# Patient Record
Sex: Male | Born: 1980 | Race: Black or African American | Hispanic: No | Marital: Married | Smoking: Never smoker
Health system: Southern US, Community
[De-identification: ages and names within clinical notes are randomized; demographics above are authoritative.]

## PROBLEM LIST (undated history)

## (undated) DIAGNOSIS — E669 Obesity, unspecified: Secondary | ICD-10-CM

## (undated) DIAGNOSIS — E785 Hyperlipidemia, unspecified: Secondary | ICD-10-CM

## (undated) DIAGNOSIS — I10 Essential (primary) hypertension: Secondary | ICD-10-CM

## (undated) DIAGNOSIS — I428 Other cardiomyopathies: Secondary | ICD-10-CM

## (undated) HISTORY — DX: Obesity, unspecified: E66.9

## (undated) HISTORY — PX: HIP PINNING: SHX1757

## (undated) HISTORY — DX: Essential (primary) hypertension: I10

## (undated) HISTORY — DX: Hyperlipidemia, unspecified: E78.5

## (undated) HISTORY — DX: Other cardiomyopathies: I42.8

---

## 2000-05-29 ENCOUNTER — Ambulatory Visit (HOSPITAL_BASED_OUTPATIENT_CLINIC_OR_DEPARTMENT_OTHER): Admission: RE | Admit: 2000-05-29 | Discharge: 2000-05-29 | Payer: Self-pay | Admitting: Unknown Physician Specialty

## 2000-07-04 ENCOUNTER — Emergency Department (HOSPITAL_COMMUNITY): Admission: EM | Admit: 2000-07-04 | Discharge: 2000-07-04 | Payer: Self-pay | Admitting: Emergency Medicine

## 2001-02-25 ENCOUNTER — Encounter: Payer: Self-pay | Admitting: Emergency Medicine

## 2001-02-25 ENCOUNTER — Emergency Department (HOSPITAL_COMMUNITY): Admission: EM | Admit: 2001-02-25 | Discharge: 2001-02-25 | Payer: Self-pay | Admitting: Emergency Medicine

## 2003-03-25 ENCOUNTER — Encounter: Payer: Self-pay | Admitting: Emergency Medicine

## 2003-03-25 ENCOUNTER — Emergency Department (HOSPITAL_COMMUNITY): Admission: EM | Admit: 2003-03-25 | Discharge: 2003-03-25 | Payer: Self-pay | Admitting: Emergency Medicine

## 2003-11-27 ENCOUNTER — Emergency Department (HOSPITAL_COMMUNITY): Admission: EM | Admit: 2003-11-27 | Discharge: 2003-11-28 | Payer: Self-pay | Admitting: Emergency Medicine

## 2004-11-28 ENCOUNTER — Emergency Department (HOSPITAL_COMMUNITY): Admission: EM | Admit: 2004-11-28 | Discharge: 2004-11-28 | Payer: Self-pay | Admitting: Emergency Medicine

## 2005-07-08 ENCOUNTER — Emergency Department (HOSPITAL_COMMUNITY): Admission: EM | Admit: 2005-07-08 | Discharge: 2005-07-08 | Payer: Self-pay | Admitting: Family Medicine

## 2005-09-20 ENCOUNTER — Emergency Department (HOSPITAL_COMMUNITY): Admission: EM | Admit: 2005-09-20 | Discharge: 2005-09-20 | Payer: Self-pay | Admitting: Family Medicine

## 2006-06-14 ENCOUNTER — Emergency Department (HOSPITAL_COMMUNITY): Admission: EM | Admit: 2006-06-14 | Discharge: 2006-06-14 | Payer: Self-pay | Admitting: Family Medicine

## 2007-03-10 ENCOUNTER — Emergency Department (HOSPITAL_COMMUNITY): Admission: EM | Admit: 2007-03-10 | Discharge: 2007-03-10 | Payer: Self-pay | Admitting: Emergency Medicine

## 2008-07-23 ENCOUNTER — Emergency Department (HOSPITAL_COMMUNITY): Admission: EM | Admit: 2008-07-23 | Discharge: 2008-07-24 | Payer: Self-pay | Admitting: Emergency Medicine

## 2008-11-01 ENCOUNTER — Observation Stay (HOSPITAL_COMMUNITY): Admission: EM | Admit: 2008-11-01 | Discharge: 2008-11-02 | Payer: Self-pay | Admitting: Emergency Medicine

## 2010-11-05 LAB — CBC
HCT: 44.2 % (ref 39.0–52.0)
Hemoglobin: 15.1 g/dL (ref 13.0–17.0)
MCHC: 34.1 g/dL (ref 30.0–36.0)
MCHC: 34.8 g/dL (ref 30.0–36.0)
MCV: 88.1 fL (ref 78.0–100.0)
Platelets: 190 10*3/uL (ref 150–400)
Platelets: 202 10*3/uL (ref 150–400)
RBC: 4.45 MIL/uL (ref 4.22–5.81)
RBC: 5.02 MIL/uL (ref 4.22–5.81)
RDW: 13.1 % (ref 11.5–15.5)
RDW: 13.6 % (ref 11.5–15.5)
WBC: 8.1 10*3/uL (ref 4.0–10.5)

## 2010-11-05 LAB — HEMOGLOBIN A1C: Mean Plasma Glucose: 235 mg/dL

## 2010-11-05 LAB — DIFFERENTIAL
Basophils Absolute: 0.1 10*3/uL (ref 0.0–0.1)
Basophils Relative: 1 % (ref 0–1)
Eosinophils Absolute: 0.2 10*3/uL (ref 0.0–0.7)
Lymphocytes Relative: 32 % (ref 12–46)
Lymphs Abs: 2.6 10*3/uL (ref 0.7–4.0)
Neutro Abs: 4.1 10*3/uL (ref 1.7–7.7)
Neutrophils Relative %: 50 % (ref 43–77)
Neutrophils Relative %: 60 % (ref 43–77)

## 2010-11-05 LAB — URINALYSIS, ROUTINE W REFLEX MICROSCOPIC
Bilirubin Urine: NEGATIVE
Glucose, UA: >1000 mg/dL — AB
Ketones, ur: 40 mg/dL — AB
Leukocytes, UA: NEGATIVE
pH: 5.5 (ref 5.0–8.0)

## 2010-11-05 LAB — GLUCOSE, CAPILLARY
Glucose-Capillary: 256 mg/dL — ABNORMAL HIGH (ref 70–99)
Glucose-Capillary: 285 mg/dL — ABNORMAL HIGH (ref 70–99)
Glucose-Capillary: 315 mg/dL — ABNORMAL HIGH (ref 70–99)
Glucose-Capillary: 369 mg/dL — ABNORMAL HIGH (ref 70–99)

## 2010-11-05 LAB — BASIC METABOLIC PANEL
BUN: 10 mg/dL (ref 6–23)
CO2: 26 mEq/L (ref 19–32)
Calcium: 9.4 mg/dL (ref 8.4–10.5)
Chloride: 100 mEq/L (ref 96–112)
Creatinine, Ser: 1.07 mg/dL (ref 0.4–1.5)
GFR calc Af Amer: >60 mL/min (ref >60)
GFR calc non Af Amer: >60 mL/min (ref >60)
Glucose, Bld: 362 mg/dL — ABNORMAL HIGH (ref 70–99)
Potassium: 4.3 mEq/L (ref 3.5–5.1)
Sodium: 134 mEq/L — ABNORMAL LOW (ref 135–145)

## 2010-11-05 LAB — COMPREHENSIVE METABOLIC PANEL
Alkaline Phosphatase: 124 U/L — ABNORMAL HIGH (ref 39–117)
BUN: 13 mg/dL (ref 6–23)
CO2: 27 mEq/L (ref 19–32)
GFR calc non Af Amer: >60 mL/min (ref >60)
Glucose, Bld: 200 mg/dL — ABNORMAL HIGH (ref 70–99)
Potassium: 3.7 mEq/L (ref 3.5–5.1)
Total Bilirubin: 0.6 mg/dL (ref 0.3–1.2)
Total Protein: 6.3 g/dL (ref 6.0–8.3)

## 2010-11-05 LAB — HEPATIC FUNCTION PANEL
ALT: 24 U/L (ref 0–53)
AST: 20 U/L (ref 0–37)
Albumin: 3.9 g/dL (ref 3.5–5.2)
Alkaline Phosphatase: 147 U/L — ABNORMAL HIGH (ref 39–117)
Bilirubin, Direct: 0.2 mg/dL (ref 0.0–0.3)
Indirect Bilirubin: 0.7 mg/dL (ref 0.3–0.9)
Total Bilirubin: 0.9 mg/dL (ref 0.3–1.2)
Total Protein: 7.6 g/dL (ref 6.0–8.3)

## 2010-12-09 NOTE — Discharge Summary (Signed)
Sean Rogers, Sean Rogers                ACCOUNT NO.:  [id number]   MEDICAL RECORD NO.:  [id number]          PATIENT TYPE:  INP   LOCATION:  5154                         FACILITY:  [facility]   PHYSICIAN:  Gardiner Barefoot, MD    DATE OF BIRTH:  01/03/1981   DATE OF ADMISSION:  11/01/2008  DATE OF DISCHARGE:  11/02/2008                               DISCHARGE SUMMARY   He was last seen at [facility].   HISTORY OF PRESENT ILLNESS:  Please see previously dictated history and  physical.  Briefly, Mr. Gheen is a 30 year old African American male  with history of migraine headaches who reported about a 2 week history  of progressive symptoms including some dizziness, blurred vision,  increased urination, increased thirst.  He denied any other symptoms  associated with this including no fever or chills.   DISCHARGE DIAGNOSES:  1. Type 2 diabetes, newly diagnosed.  2. Migraine headaches.  3. Morbid obesity.  4. History of hip pin placement from old football injury.   MEDICATIONS AT DISCHARGE:  Metformin 500 mg p.o. daily.   HOSPITAL COURSE:  The patient was evaluated in the emergency room and  noted to have blood sugar of 362, with bicarb of 26 and a normal CBC.  The patient was admitted for continued management of his hyperglycemia.  The patient was put on sliding scale insulin and metformin 500 mg a day.  The patient did have normal bicarb, was not acidotic and received IV  fluids.  On the morning of the discharge, blood sugar was 200 with  bicarb of 27 and normal potassium.  The patient felt improved.  However,  he did still complain of continued eye blurriness, though it is  consistent with hyperglycemia and worsening diabetes and I did advise  him to see an ophthalmologist through his PCP.  I had a long  conversation with the patient regarding his weight and he need to loose  significant amount of weight, at least 50% of his current body weight.  I also had a discussion  with him of eating smaller meals throughout the  day and to overall eat less, eat more healthy and increase his exercise.  Further more, I discussed with the patient that along with his blood  sugar medication, he will also need to be evaluated for cholesterol as  well as we will need to start an ACE inhibitor as an outpatient.  Deferring at this time as he is starting metformin to assure that he  does not have any issues with his metformin.  The patient voiced  understanding of all this and he states he is motivated to do things  necessary to improve his health and live longer.  The patient was in  good condition and ambulatory at discharge with no further  recommendations for acute hospitalizations.      Gardiner Barefoot, MD  Electronically Signed     RWC/MEDQ  D:  11/02/2008  T:  11/03/2008  Job:  [id number]

## 2010-12-09 NOTE — H&P (Signed)
NAMEANGIE, Sean Rogers                ACCOUNT NO.:  [id number]   MEDICAL RECORD NO.:  [id number]          PATIENT TYPE:  INP   LOCATION:  5154                         FACILITY:  [facility]   PHYSICIAN:  Selena Batten, MD     DATE OF BIRTH:  November 22, 1980   DATE OF ADMISSION:  11/01/2008  DATE OF DISCHARGE:                              HISTORY & PHYSICAL   CHIEF COMPLAINT:  I haven't been feeling well and monitor said I had  had blood sugar.   HISTORY OF PRESENT ILLNESS:  This is a 30 year old African American male  with past medical history significant for possible headaches who was  found to have high blood glucose today.  Over the past week, the patient  has noted that he has had some dizzy spells and has been seeing floaters  as well as colors.  He has had increasing thirst as well as increasing  urination.  This is new for him.  He denies any other fever, chills,  shortness of breath, weight loss, or weight gain.  He denies any other  medical issues at this time.  He is sleeping well and has good exercise  tolerance.   PAST MEDICAL HISTORY:  Possible headaches.   MEDICATIONS:  None.   ALLERGIES:  No known drug allergies.   FAMILY HISTORY:  Multiple individuals with diabetes mellitus.   PAST SURGICAL HISTORY:  The patient had a hip pin placement.   REVIEW OF SYSTEMS:  A 14-point review of system was performed and is  negative except as per HPI.   PHYSICAL EXAMINATION:  VITAL SIGNS:  Blood pressure is 141/88, pulse is  94, he is sating 97% on room air.  GENERAL:  In no acute distress.  HEAD:  Normocephalic and atraumatic.  EYES:  Pupils equal, round, and reactive to light.  Extraocular muscles  are intact.  NECK:  Supple.  No masses, no bruits, no thyromegaly.  LUNGS:  Clear to auscultation bilaterally.  HEART:  Regular rate and rhythm without any murmurs, rubs, or gallops.  ABDOMEN:  Positive bowel sounds.  Soft, nontender, nondistended.  EXTREMITIES:  No clubbing, cyanosis, or  edema.  NEUROLOGIC:  Alert and oriented x3.  Moving all extremities x4.  Pulses  +2 equivocally.   LABORATORY DATA:  His glucose is 362.  Hemoglobin A1c is pending.   ASSESSMENT:  This is a 30 year old African American male with no  significant past medical history who presents with new-onset diabetes  mellitus.  Diabetes mellitus.  We will continue IV fluids overnight.  We will treat  him with sliding scale insulin over the next few hours.  In the a.m., we  will start the patient on metformin 500 mg 1 by mouth once daily.  In  addition, we will  have the diabetes educator and nutrition to see him to help establish  different patterns for more a diabetic prudent lifestyle and on  discharge, we will arrange follow up with primary care physician to help  facilitate management of his diabetes on an outpatient basis.      Selena Batten, MD  Electronically Signed  BS/MEDQ  D:  11/01/2008  T:  11/02/2008  Job:  [id number]

## 2011-05-04 LAB — HM DIABETES FOOT EXAM: HM Diabetic Foot Exam: NORMAL

## 2011-05-04 LAB — HM DIABETES EYE EXAM

## 2011-05-11 LAB — I-STAT 8, (EC8 V) (CONVERTED LAB)
BUN: 15
Chloride: 105
HCT: 45
Potassium: 3.7
pCO2, Ven: 48
pH, Ven: 7.39 — ABNORMAL HIGH

## 2011-05-11 LAB — TSH: TSH: 1.357

## 2011-05-11 LAB — POCT I-STAT CREATININE: Operator id: 247071

## 2011-08-05 ENCOUNTER — Encounter: Payer: Self-pay | Admitting: Physician Assistant

## 2011-08-05 DIAGNOSIS — I10 Essential (primary) hypertension: Secondary | ICD-10-CM

## 2011-08-05 DIAGNOSIS — E119 Type 2 diabetes mellitus without complications: Secondary | ICD-10-CM | POA: Insufficient documentation

## 2011-08-05 DIAGNOSIS — E785 Hyperlipidemia, unspecified: Secondary | ICD-10-CM

## 2011-08-05 DIAGNOSIS — E669 Obesity, unspecified: Secondary | ICD-10-CM

## 2011-08-10 ENCOUNTER — Ambulatory Visit (INDEPENDENT_AMBULATORY_CARE_PROVIDER_SITE_OTHER): Payer: Self-pay | Admitting: Physician Assistant

## 2011-08-10 ENCOUNTER — Ambulatory Visit: Payer: Self-pay | Admitting: Physician Assistant

## 2011-08-10 DIAGNOSIS — E119 Type 2 diabetes mellitus without complications: Secondary | ICD-10-CM

## 2011-08-10 DIAGNOSIS — I10 Essential (primary) hypertension: Secondary | ICD-10-CM

## 2011-11-23 ENCOUNTER — Ambulatory Visit: Payer: 59 | Admitting: Physician Assistant

## 2011-11-30 ENCOUNTER — Ambulatory Visit: Payer: 59 | Admitting: Physician Assistant

## 2012-01-25 ENCOUNTER — Encounter: Payer: Self-pay | Admitting: Physician Assistant

## 2012-01-25 ENCOUNTER — Ambulatory Visit (INDEPENDENT_AMBULATORY_CARE_PROVIDER_SITE_OTHER): Payer: 59 | Admitting: Physician Assistant

## 2012-01-25 VITALS — BP 134/88 | HR 96 | Temp 98.6°F | Resp 16 | Ht 69.0 in | Wt 264.4 lb

## 2012-01-25 DIAGNOSIS — R739 Hyperglycemia, unspecified: Secondary | ICD-10-CM

## 2012-01-25 DIAGNOSIS — E119 Type 2 diabetes mellitus without complications: Secondary | ICD-10-CM

## 2012-01-25 DIAGNOSIS — IMO0001 Reserved for inherently not codable concepts without codable children: Secondary | ICD-10-CM

## 2012-01-25 LAB — POCT UA - MICROSCOPIC ONLY
Bacteria, U Microscopic: NEGATIVE
Casts, Ur, LPF, POC: NEGATIVE
Mucus, UA: NEGATIVE
RBC, urine, microscopic: NEGATIVE

## 2012-01-25 LAB — POCT URINALYSIS DIPSTICK
Bilirubin, UA: NEGATIVE
Blood, UA: NEGATIVE
Glucose, UA: 2000
Ketones, UA: NEGATIVE
Leukocytes, UA: NEGATIVE
Nitrite, UA: NEGATIVE
pH, UA: 6

## 2012-01-25 LAB — GLUCOSE, POCT (MANUAL RESULT ENTRY)

## 2012-01-25 MED ORDER — METFORMIN HCL 1000 MG PO TABS: 1000.0000 mg | ORAL_TABLET | Freq: Two times a day (BID) | ORAL | Status: DC

## 2012-01-25 MED ORDER — GLIPIZIDE 5 MG PO TABS: 5.0000 mg | ORAL_TABLET | Freq: Two times a day (BID) | ORAL | Status: DC

## 2012-01-25 MED ORDER — INSULIN LISPRO 100 UNIT/ML ~~LOC~~ SOLN
5.0000 [IU] | Freq: Once | SUBCUTANEOUS | Status: AC
  Administered 2012-01-25: 5 [IU] via SUBCUTANEOUS

## 2012-01-25 MED ORDER — INSULIN GLARGINE 100 UNIT/ML ~~LOC~~ SOLN: 10.0000 [IU] | Freq: Every day | SUBCUTANEOUS | Status: DC

## 2012-01-25 NOTE — Progress Notes (Signed)
Patient ID: MURRIEL EIDEM MRN: [id number], DOB: Jul 02, 1981, 31 y.o. Date of Encounter: 01/25/2012, 4:10 PM  Primary Physician: No primary provider on file.  Chief Complaint: Diabetes follow up  HPI: 31 y.o. year old male with history below presents for follow up of diabetes mellitus. Ran out of his medications about 2 months prior, did not get them refilled until about a month ago. While he was not taking his medications he began to eat poorly again and stopped his exercise routine. His blood sugars routinely were in the 300 range to unreadable. Since he has restarted his Metformin 1000 mg bid and Glipizide 5 mg bid he sugars are coming down at home with an average in the "200's". While he was out of his medications he was getting up 4-5 times per night to void, this has decreased to 2 times per night now that he is back to taking his medications daily. While his blood sugar was elevated he was having headaches, but these are now improving. No chest pain. Has restarted his exercise routine, now beginning to eat healthy again.  Last A1C: 6.1% on 08/10/11.  Eye MD: Advised DDS: Advised Influenza vaccine: 05/04/11 Pneumococcal vaccine: 05/04/11 Last CPE: 05/04/11  Past Medical History  Diagnosis Date  . Diabetes mellitus   . Obesity   . Hyperlipidemia   . Hypertension      Home Meds: Prior to Admission medications   Medication Sig Start Date End Date Taking? Authorizing Provider  aspirin 81 MG tablet Take 160 mg by mouth daily.   Yes Historical Provider, MD  glipiZIDE (GLUCOTROL) 5 MG tablet Take 5 mg by mouth 2 (two) times daily before a meal.   Yes Historical Provider, MD  lisinopril-hydrochlorothiazide (PRINZIDE,ZESTORETIC) 20-12.5 MG per tablet Take 1 tablet by mouth daily.   Yes Historical Provider, MD  metFORMIN (GLUCOPHAGE) 1000 MG tablet Take 1,000 mg by mouth 2 (two) times daily with a meal.   Yes Historical Provider, MD  OVER THE COUNTER MEDICATION B12 take one daily   Yes  Historical Provider, MD  OVER THE COUNTER MEDICATION OTC allergy relief 10 mg   Yes Historical Provider, MD  simvastatin (ZOCOR) 40 MG tablet Take 40 mg by mouth at bedtime.   Yes Historical Provider, MD    Allergies: No Known Allergies  History   Social History  . Marital Status: Single    Spouse Name: N/A    Number of Children: N/A  . Years of Education: N/A   Occupational History  . Not on file.   Social History Main Topics  . Smoking status: Never Smoker   . Smokeless tobacco: Not on file  . Alcohol Use: No  . Drug Use: No  . Sexually Active:    Other Topics Concern  . Not on file   Social History Narrative  . No narrative on file     Review of Systems: Constitutional: negative for chills, fever, night sweats, weight changes, or fatigue  HEENT: negative for vision changes, hearing loss, congestion, rhinorrhea, or epistaxis Cardiovascular: negative for chest pain, palpitations, diaphoresis, DOE, orthopnea, or edema Respiratory: negative for hemoptysis, wheezing, shortness of breath, dyspnea, or cough Abdominal: negative for abdominal pain, nausea, vomiting, diarrhea, or constipation Dermatological: negative for rash, erythema, or wounds Neurologic: negative for dizziness, or syncope All other systems reviewed and are otherwise negative with the exception to those above and in the HPI.   Physical Exam: Blood pressure 134/88, pulse 96, temperature 98.6 F (37 C), temperature  source Oral, resp. rate 16, height 5\' 9"  (1.753 m), weight 264 lb 6.4 oz (119.931 kg)., Body mass index is 39.05 kg/(m^2). General: Well developed, well nourished, in no acute distress. Head: Normocephalic, atraumatic, eyes without discharge, sclera non-icteric, nares are without discharge. Bilateral auditory canals clear, TM's are without perforation, pearly grey and translucent with reflective cone of light bilaterally. Oral cavity moist, posterior pharynx without exudate, erythema, peritonsillar  abscess, or post nasal drip.  Neck: Supple. No thyromegaly. Full ROM. No lymphadenopathy. Lungs: Clear bilaterally to auscultation without wheezes, rales, or rhonchi. Breathing is unlabored. Heart: RRR with S1 S2. No murmurs, rubs, or gallops appreciated. Msk:  Strength and tone normal for age. Extremities/Skin: Warm and dry. No clubbing or cyanosis. No edema. No rashes, wounds, or suspicious lesions. Monofilament exam unremarkable bilaterally.  Neuro: Alert and oriented X 3. Moves all extremities spontaneously. Gait is normal. CNII-XII grossly in tact. Psych:  Responds to questions appropriately with a normal affect.   Labs: Results for orders placed in visit on 01/25/12  GLUCOSE, POCT (MANUAL RESULT ENTRY)      Component Value Range   POC Glucose HHH  70 - 99 mg/dl  POCT GLYCOSYLATED HEMOGLOBIN (HGB A1C)      Component Value Range   Hemoglobin A1C 13.7    POCT URINALYSIS DIPSTICK      Component Value Range   Color, UA lt yellow     Clarity, UA clear     Glucose, UA 2000     Bilirubin, UA neg     Ketones, UA neg     Spec Grav, UA 1.010     Blood, UA neg     pH, UA 6.0     Protein, UA neg     Urobilinogen, UA 0.2     Nitrite, UA neg     Leukocytes, UA Negative    POCT UA - MICROSCOPIC ONLY      Component Value Range   WBC, Ur, HPF, POC neg     RBC, urine, microscopic neg     Bacteria, U Microscopic neg     Mucus, UA neg     Epithelial cells, urine per micros 0-1     Crystals, Ur, HPF, POC neg     Casts, Ur, LPF, POC neg     Yeast, UA neg      CMP and serum ketones pending.  ASSESSMENT AND PLAN:  31 y.o. year old male with now uncontrolled diabetes mellitus secondary to running out of his medications -Humalog 5 units today -Add Lantus 10 units qhs, hold if sugar is less than 200 -Continue Metformin 1000 mg 1 po bid RF 2 -Continue Glipizide 5 mg #60 1 po bid RF 2 -Recheck 24 hours -Take meds daily -Healthy diet and exercise -Discussed with Dr.  Alwyn Ren  Signed, Eula Listen, PA-C 01/25/2012 4:10 PM

## 2012-01-26 ENCOUNTER — Ambulatory Visit: Payer: 59

## 2012-01-26 ENCOUNTER — Encounter (INDEPENDENT_AMBULATORY_CARE_PROVIDER_SITE_OTHER): Payer: Managed Care, Other (non HMO) | Admitting: Physician Assistant

## 2012-01-26 ENCOUNTER — Encounter: Payer: Self-pay | Admitting: Physician Assistant

## 2012-01-26 ENCOUNTER — Ambulatory Visit (INDEPENDENT_AMBULATORY_CARE_PROVIDER_SITE_OTHER): Payer: 59 | Admitting: Internal Medicine

## 2012-01-26 VITALS — BP 110/80 | HR 92 | Temp 98.3°F | Resp 16 | Ht 69.5 in | Wt 263.2 lb

## 2012-01-26 DIAGNOSIS — R739 Hyperglycemia, unspecified: Secondary | ICD-10-CM

## 2012-01-26 DIAGNOSIS — R7309 Other abnormal glucose: Secondary | ICD-10-CM

## 2012-01-26 DIAGNOSIS — IMO0001 Reserved for inherently not codable concepts without codable children: Secondary | ICD-10-CM

## 2012-01-26 DIAGNOSIS — R9431 Abnormal electrocardiogram [ECG] [EKG]: Secondary | ICD-10-CM

## 2012-01-26 LAB — POCT CBC
Granulocyte percent: 51.8 %G (ref 37–80)
HCT, POC: 47.9 % (ref 43.5–53.7)
Lymph, poc: 3.1 (ref 0.6–3.4)
MCHC: 32.4 g/dL (ref 31.8–35.4)
MID (cbc): 0.4 (ref 0–0.9)
MPV: 9.9 fL (ref 0–99.8)
POC Granulocyte: 3.7 (ref 2–6.9)
POC LYMPH PERCENT: 42.9 %L (ref 10–50)
POC MID %: 5.3 %M (ref 0–12)
Platelet Count, POC: 270 10*3/uL (ref 142–424)
RDW, POC: 13.6 %

## 2012-01-26 LAB — COMPREHENSIVE METABOLIC PANEL
ALT: 21 U/L (ref 0–53)
BUN: 10 mg/dL (ref 6–23)
CO2: 24 mEq/L (ref 19–32)
Calcium: 9.5 mg/dL (ref 8.4–10.5)
Chloride: 98 mEq/L (ref 96–112)
Creat: 1.02 mg/dL (ref 0.50–1.35)
Total Bilirubin: 0.6 mg/dL (ref 0.3–1.2)

## 2012-01-26 LAB — GLUCOSE, POCT (MANUAL RESULT ENTRY): POC Glucose: 321 mg/dl — AB (ref 70–99)

## 2012-01-26 LAB — KETONES, QUALITATIVE: Acetone, Bld: NEGATIVE

## 2012-01-26 MED ORDER — INSULIN LISPRO 100 UNIT/ML ~~LOC~~ SOLN
10.0000 [IU] | Freq: Once | SUBCUTANEOUS | Status: AC
  Administered 2012-01-26: 10 [IU] via SUBCUTANEOUS

## 2012-01-26 NOTE — Progress Notes (Signed)
Patient ID: Sean Rogers MRN: [id number], DOB: 06-Oct-1980, 31 y.o. Date of Encounter: 01/26/2012, 8:38 AM  Primary Physician: No primary provider on file.  Chief Complaint: Diabetes follow up  HPI: 31 y.o. year old male with history below presents for follow up of diabetes mellitus/hyperglycemia. Previously well controlled. See office visit from 01/25/12. Sugar during his office visit exceeded linearity. Hospital read as 504. Was given 10 units of Humalog during that visit. Sugar at home the previous evening was 323. At that time he gave himself 10 units of Lantus. Has not yet check his blood sugar this morning. He has taken his Metformin 1000 mg and his Glipizide 5 mg. He states that he just does not feel well. Complains of a headache, fatigue, and general achiness. Did not sleep well. No chest pain, palpitations, SOB, wheezing, dyspnea, or edema.    Last A1C: 13.7 on 01/25/12 prior to that last A1C was 6.1 on 08/10/11.   Past Medical History  Diagnosis Date  . Diabetes mellitus   . Obesity   . Hyperlipidemia   . Hypertension      Home Meds: Prior to Admission medications   Medication Sig Start Date End Date Taking? Authorizing Provider  aspirin 81 MG tablet Take 160 mg by mouth daily.   Yes Historical Provider, MD  glipiZIDE (GLUCOTROL) 5 MG tablet Take 1 tablet (5 mg total) by mouth 2 (two) times daily before a meal. 01/25/12  Yes Taresa Montville M Shiara Mcgough, PA-C  insulin glargine (LANTUS SOLOSTAR) 100 UNIT/ML injection Inject 10 Units into the skin at bedtime. 01/25/12 01/24/13 Yes Jebadiah Imperato M Journee Kohen, PA-C  lisinopril-hydrochlorothiazide (PRINZIDE,ZESTORETIC) 20-12.5 MG per tablet Take 1 tablet by mouth daily.   Yes Historical Provider, MD  metFORMIN (GLUCOPHAGE) 1000 MG tablet Take 1 tablet (1,000 mg total) by mouth 2 (two) times daily with a meal. 01/25/12  Yes Haziel Molner M Tyrie Porzio, PA-C  OVER THE COUNTER MEDICATION B12 take one daily   Yes Historical Provider, MD  OVER THE COUNTER MEDICATION OTC allergy relief 10  mg   Yes Historical Provider, MD  simvastatin (ZOCOR) 40 MG tablet Take 40 mg by mouth at bedtime.   Yes Historical Provider, MD    Allergies: No Known Allergies  History   Social History  . Marital Status: Single    Spouse Name: N/A    Number of Children: N/A  . Years of Education: N/A   Occupational History  . Not on file.   Social History Main Topics  . Smoking status: Never Smoker   . Smokeless tobacco: Not on file  . Alcohol Use: No  . Drug Use: No  . Sexually Active:    Other Topics Concern  . Not on file   Social History Narrative  . No narrative on file     Review of Systems: Constitutional: negative for fever, night sweats, or weight changes HEENT: negative for vision changes, hearing loss, congestion, rhinorrhea, or epistaxis Cardiovascular: negative for chest pain, palpitations, diaphoresis, DOE, orthopnea, or edema Respiratory: negative for hemoptysis, wheezing, shortness of breath, dyspnea, or cough Abdominal: negative for abdominal pain, nausea, vomiting, diarrhea, or constipation Dermatological: negative for rash, erythema, or wounds Neurologic: negative for headache, dizziness, or syncope All other systems reviewed and are otherwise negative with the exception to those above and in the HPI.   Physical Exam: Blood pressure 110/80, pulse 92, temperature 98.3 F (36.8 C), temperature source Oral, resp. rate 16, height 5' 9.5" (1.765 m), weight 263 lb 3.2 oz (119.387  kg), SpO2 99.00%., Body mass index is 38.31 kg/(m^2). General: Well developed, well nourished, in no acute distress. Head: Normocephalic, atraumatic, eyes without discharge, sclera non-icteric, nares are without discharge. Bilateral auditory canals clear, TM's are without perforation, pearly grey and translucent with reflective cone of light bilaterally. Oral cavity moist, posterior pharynx without exudate, erythema, peritonsillar abscess, or post nasal drip.  Neck: Supple. No thyromegaly. Full  ROM. No lymphadenopathy. Lungs: Clear bilaterally to auscultation without wheezes, rales, or rhonchi. Breathing is unlabored. Heart: RRR with S1 S2. No murmurs, rubs, or gallops appreciated. Abdomen: Soft, non-tender, non-distended with normoactive bowel sounds. No hepatosplenomegaly. No rebound/guarding. No obvious abdominal masses. Msk:  Strength and tone normal for age. Extremities/Skin: Warm and dry. No clubbing or cyanosis. No edema. No rashes, wounds, or suspicious lesions. Monofilament exam unremarkable bilaterally.  Neuro: Alert and oriented X 3. Moves all extremities spontaneously. Gait is normal. CNII-XII grossly in tact. Psych:  Responds to questions appropriately with a normal affect.   Labs: Results for orders placed in visit on 01/26/12  POCT CBC      Component Value Range   WBC 7.2  4.6 - 10.2 K/uL   Lymph, poc 3.1  0.6 - 3.4   POC LYMPH PERCENT 42.9  10 - 50 %L   MID (cbc) 0.4  0 - 0.9   POC MID % 5.3  0 - 12 %M   POC Granulocyte 3.7  2 - 6.9   Granulocyte percent 51.8  37 - 80 %G   RBC 5.19  4.69 - 6.13 M/uL   Hemoglobin 15.5  14.1 - 18.1 g/dL   HCT, POC 40.9  81.1 - 53.7 %   MCV 92.2  80 - 97 fL   MCH, POC 29.9  27 - 31.2 pg   MCHC 32.4  31.8 - 35.4 g/dL   RDW, POC 91.4     Platelet Count, POC 270  142 - 424 K/uL   MPV 9.9  0 - 99.8 fL  GLUCOSE, POCT (MANUAL RESULT ENTRY)      Component Value Range   POC Glucose 321 (*) 70 - 99 mg/dl  Fasting sample.  WSR: 19 mm/hr  [facility] reading (PRIMARY) by  Dr. Merla Riches. Borderline cardiomegaly  EKG: LV strain. Read by Dr. Merla Riches. EKG interpretation by Dr. Jacinto Halim: early repolarization without signs of ischemia.   Glucose at office visit 01/25/12 was 504  ASSESSMENT AND PLAN:  31 y.o. year old male with previously controlled DM, now uncontrolled, improving DM, and EKG with LV strain -Humalog 10 units in office -Discussed in detail risk of EKG, patient declines ED evaluation at this time. Plans to follow up 24 hours  for recheck. He has been given strict and detailed RTC/ER precautions. I believe at this time EKG is more indicative of strain pattern, rather than ischemia based on patient's presenting complaints, appearance, and underlying blood sugars.  -Continue current medications -Recheck 24 hours -Cardiology referral within next couple of days with Dr. Jacinto Halim -Patient discussed with Dr. Merla Riches, agrees with above evaluation and treatment plan -Spoke with Dr. Jacinto Halim over the phone and faxed today's EKG as well as EKG from 05/04/2011 for comparison and review. He states: Early repolarization without signs of ischemia. Recommends risk stratification.    Signed, Eula Listen, PA-C 01/26/2012 8:38 AM

## 2012-01-27 ENCOUNTER — Encounter: Payer: Self-pay | Admitting: Physician Assistant

## 2012-01-27 ENCOUNTER — Ambulatory Visit (INDEPENDENT_AMBULATORY_CARE_PROVIDER_SITE_OTHER): Payer: 59 | Admitting: Physician Assistant

## 2012-01-27 VITALS — BP 120/62 | HR 84 | Temp 98.2°F | Resp 16 | Ht 69.5 in | Wt 264.0 lb

## 2012-01-27 DIAGNOSIS — R7309 Other abnormal glucose: Secondary | ICD-10-CM

## 2012-01-27 DIAGNOSIS — R739 Hyperglycemia, unspecified: Secondary | ICD-10-CM

## 2012-01-27 DIAGNOSIS — IMO0001 Reserved for inherently not codable concepts without codable children: Secondary | ICD-10-CM

## 2012-01-27 DIAGNOSIS — R5383 Other fatigue: Secondary | ICD-10-CM

## 2012-01-27 DIAGNOSIS — R5381 Other malaise: Secondary | ICD-10-CM

## 2012-01-27 NOTE — Progress Notes (Signed)
Patient ID: Sean Rogers MRN: [id number], DOB: Feb 04, 1981, 31 y.o. Date of Encounter: 01/27/2012, 9:49 AM  Primary Physician: No primary provider on file.  Chief Complaint: Recheck hyperglycemia  HPI: 31 y.o. year old male with history below presents for recheck of hyperglycemia. See previous office visits. In short patient was previously well controlled, however he stopped taking his medications for some time. He returned to clinic for routine DM recheck on 01/25/12 and was found to have a blood sugar of 504 and an A1C of 13.7. Since that time he has been given 10 units of Humalog daily in our office, continued his at home medications, and added in 10 units of Lantus each night.  He comes in today feeling much better than he did the previous day, although still not 100%. No further headache. No as fatigued. No aches. He is currently fasting, and has not yet check his blood sugar this morning. The previous night prior to his dose of Lantus his blood sugar was 267. No chest pains, SOB, wheezing, palpitations, dyspnea, or edema. Still getting up to void 2 times per night, at baseline he sleeps through the night. This is down from 5 times per night while his sugars were elevated.  He does have a referral made to see Dr. Jacinto Halim for risk stratification within the next couple of days.    Past Medical History  Diagnosis Date  . Diabetes mellitus   . Obesity   . Hyperlipidemia   . Hypertension      Home Meds: Prior to Admission medications   Medication Sig Start Date End Date Taking? Authorizing Provider  aspirin 81 MG tablet Take 160 mg by mouth daily.   Yes Historical Provider, MD  glipiZIDE (GLUCOTROL) 5 MG tablet Take 1 tablet (5 mg total) by mouth 2 (two) times daily before a meal. 01/25/12  Yes Deion Swift M Yesenia Locurto, PA-C  insulin glargine (LANTUS SOLOSTAR) 100 UNIT/ML injection Inject 10 Units into the skin at bedtime. 01/25/12 01/24/13 Yes Harrell Niehoff M Lilliana Turner, PA-C  lisinopril-hydrochlorothiazide  (PRINZIDE,ZESTORETIC) 20-12.5 MG per tablet Take 1 tablet by mouth daily.   Yes Historical Provider, MD  metFORMIN (GLUCOPHAGE) 1000 MG tablet Take 1 tablet (1,000 mg total) by mouth 2 (two) times daily with a meal. 01/25/12  Yes Samaya Boardley M Ryli Standlee, PA-C  OVER THE COUNTER MEDICATION B12 take one daily   Yes Historical Provider, MD  OVER THE COUNTER MEDICATION OTC allergy relief 10 mg   Yes Historical Provider, MD  simvastatin (ZOCOR) 40 MG tablet Take 40 mg by mouth at bedtime.   Yes Historical Provider, MD    Allergies: No Known Allergies  History   Social History  . Marital Status: Single    Spouse Name: N/A    Number of Children: N/A  . Years of Education: N/A   Occupational History  . Not on file.   Social History Main Topics  . Smoking status: Never Smoker   . Smokeless tobacco: Not on file  . Alcohol Use: No  . Drug Use: No  . Sexually Active:    Other Topics Concern  . Not on file   Social History Narrative  . No narrative on file     Review of Systems: Constitutional: negative for chills, fever, night sweats, weight changes, or fatigue  HEENT: negative for vision changes, hearing loss, congestion, rhinorrhea, ST, epistaxis, or sinus pressure Cardiovascular: negative for chest pain or palpitations Respiratory: negative for hemoptysis, wheezing, shortness of breath, or cough Abdominal: negative  for abdominal pain, nausea, vomiting, diarrhea, or constipation Dermatological: negative for rash Neurologic: negative for headache, dizziness, or syncope All other systems reviewed and are otherwise negative with the exception to those above and in the HPI.   Physical Exam: Blood pressure 120/62, pulse 84, temperature 98.2 F (36.8 C), temperature source Oral, resp. rate 16, height 5' 9.5" (1.765 m), weight 264 lb (119.75 kg), SpO2 98.00%., Body mass index is 38.43 kg/(m^2). General: Well developed, well nourished, in no acute distress. Head: Normocephalic, atraumatic,  eyes without discharge, sclera non-icteric, nares are without discharge. Bilateral auditory canals clear, TM's are without perforation, pearly grey and translucent with reflective cone of light bilaterally. Oral cavity moist, posterior pharynx without exudate, erythema, peritonsillar abscess, or post nasal drip.  Neck: Supple. No thyromegaly. Full ROM. No lymphadenopathy. Lungs: Clear bilaterally to auscultation without wheezes, rales, or rhonchi. Breathing is unlabored. Heart: RRR with S1 S2. No murmurs, rubs, or gallops appreciated. Msk:  Strength and tone normal for age. Extremities/Skin: Warm and dry. No clubbing or cyanosis. No edema. No rashes or suspicious lesions. Neuro: Alert and oriented X 3. Moves all extremities spontaneously. Gait is normal. CNII-XII grossly in tact. Psych:  Responds to questions appropriately with a normal affect.   Labs: Results for orders placed in visit on 01/27/12  GLUCOSE, POCT (MANUAL RESULT ENTRY)      Component Value Range   POC Glucose 295 (*) 70 - 99 mg/dl     ASSESSMENT AND PLAN:  31 y.o. year old male with slowly improving hyperglycemia and uncontrolled diabetes mellitus -Improving -Increase Lantus to 12 units QHS, continue to increase Lantus by two units every two days as long as sugars remain over 200 -Continue current medications, risks of hypoglycemia discussed. If sugar is below 225 will hold Glipizide -Recheck 48 hours -Continue with cardiology referral -May certainly be on Lantus long term, with a discontinuation of the Glipizide -RTC/ER precautions   Signed, Eula Listen, PA-C 01/27/2012 9:49 AM

## 2012-01-29 ENCOUNTER — Encounter: Payer: Self-pay | Admitting: Internal Medicine

## 2012-01-29 ENCOUNTER — Ambulatory Visit (INDEPENDENT_AMBULATORY_CARE_PROVIDER_SITE_OTHER): Payer: 59 | Admitting: Physician Assistant

## 2012-01-29 VITALS — BP 116/72 | HR 88 | Temp 98.0°F | Resp 16 | Ht 69.5 in | Wt 263.4 lb

## 2012-01-29 DIAGNOSIS — R5381 Other malaise: Secondary | ICD-10-CM

## 2012-01-29 DIAGNOSIS — R7309 Other abnormal glucose: Secondary | ICD-10-CM

## 2012-01-29 DIAGNOSIS — IMO0001 Reserved for inherently not codable concepts without codable children: Secondary | ICD-10-CM

## 2012-01-29 DIAGNOSIS — R739 Hyperglycemia, unspecified: Secondary | ICD-10-CM

## 2012-01-29 LAB — GLUCOSE, POCT (MANUAL RESULT ENTRY): POC Glucose: 287 mg/dl — AB (ref 70–99)

## 2012-01-29 MED ORDER — GLUCOSE BLOOD VI STRP: ORAL_STRIP | Status: DC

## 2012-01-29 MED ORDER — INSULIN LISPRO 100 UNIT/ML ~~LOC~~ SOLN
10.0000 [IU] | Freq: Once | SUBCUTANEOUS | Status: AC
  Administered 2012-01-29: 10 [IU] via SUBCUTANEOUS

## 2012-01-29 NOTE — Progress Notes (Signed)
Patient ID: Sean Rogers MRN: [id number], DOB: 13-Apr-1981, 31 y.o. Date of Encounter: 01/29/2012, 4:02 PM  Primary Physician: No primary provider on file.  Chief Complaint: Follow up hyperglycemia  HPI: 31 y.o. year old male with history below presents for follow up hyperglycemia. See previous office visits for details. Blood sugar initially 504 on 01/25/12, then 321 on 01/26/12, then 295 on 01/27/12, then 243 this morning. These are fasting samples outside of the 504 reading, Patient states that he continues to feel better. His blood sugars in the evening continue to be in the mid to low 300's prior to his Lantus. He continues to take his po medications daily without issues. He denies any polydipsia, polyphagia, polyuria, or nocturia. No further headaches. No chest pain, SOB, wheezing, dyspnea, palpitations, or edema.   Has not yet seen Dr. Jacinto Halim. Patient is concerned about finances.    Past Medical History  Diagnosis Date  . Diabetes mellitus   . Obesity   . Hyperlipidemia   . Hypertension      Home Meds: Prior to Admission medications   Medication Sig Start Date End Date Taking? Authorizing Provider  aspirin 81 MG tablet Take 160 mg by mouth daily.   Yes Historical Provider, MD  glipiZIDE (GLUCOTROL) 5 MG tablet Take 1 tablet (5 mg total) by mouth 2 (two) times daily before a meal. 01/25/12  Yes Jessiah Steinhart M Makyla Bye, PA-C  insulin glargine (LANTUS SOLOSTAR) 100 UNIT/ML injection Inject 10 Units into the skin at bedtime. 01/25/12 01/24/13 Yes Adjoa Althouse M Nikita Humble, PA-C  lisinopril-hydrochlorothiazide (PRINZIDE,ZESTORETIC) 20-12.5 MG per tablet Take 1 tablet by mouth daily.   Yes Historical Provider, MD  metFORMIN (GLUCOPHAGE) 1000 MG tablet Take 1 tablet (1,000 mg total) by mouth 2 (two) times daily with a meal. 01/25/12  Yes Kassady Laboy M Matsue Strom, PA-C  OVER THE COUNTER MEDICATION B12 take one daily   Yes Historical Provider, MD  OVER THE COUNTER MEDICATION OTC allergy relief 10 mg   Yes Historical Provider, MD    simvastatin (ZOCOR) 40 MG tablet Take 40 mg by mouth at bedtime.   Yes Historical Provider, MD    Allergies: No Known Allergies  History   Social History  . Marital Status: Single    Spouse Name: N/A    Number of Children: N/A  . Years of Education: N/A   Occupational History  . Not on file.   Social History Main Topics  . Smoking status: Never Smoker   . Smokeless tobacco: Not on file  . Alcohol Use: No  . Drug Use: No  . Sexually Active:    Other Topics Concern  . Not on file   Social History Narrative  . No narrative on file     Review of Systems: Constitutional: negative for chills, fever, night sweats, weight changes, or fatigue  HEENT: negative for vision changes, hearing loss, congestion, rhinorrhea, ST, epistaxis, or sinus pressure Cardiovascular: negative for chest pain or palpitations Respiratory: negative for hemoptysis, wheezing, shortness of breath, or cough Abdominal: negative for abdominal pain, nausea, vomiting, diarrhea, or constipation Dermatological: negative for rash Neurologic: negative for headache, dizziness, or syncope All other systems reviewed and are otherwise negative with the exception to those above and in the HPI.   Physical Exam: Blood pressure 116/72, pulse 88, temperature 98 F (36.7 C), resp. rate 16, height 5' 9.5" (1.765 m), weight 263 lb 6.4 oz (119.477 kg), SpO2 100.00%., Body mass index is 38.34 kg/(m^2). General: Well developed, well nourished, in no  acute distress. Head: Normocephalic, atraumatic, eyes without discharge, sclera non-icteric, nares are without discharge. Bilateral auditory canals clear, TM's are without perforation, pearly grey and translucent with reflective cone of light bilaterally. Oral cavity moist, posterior pharynx without exudate, erythema, peritonsillar abscess, or post nasal drip.  Neck: Supple. No thyromegaly. Full ROM. No lymphadenopathy. Lungs: Clear bilaterally to auscultation without wheezes,  rales, or rhonchi. Breathing is unlabored. Heart: RRR with S1 S2. No murmurs, rubs, or gallops appreciated. Msk:  Strength and tone normal for age. Extremities/Skin: Warm and dry. No clubbing or cyanosis. No edema. No rashes or suspicious lesions. Neuro: Alert and oriented X 3. Moves all extremities spontaneously. Gait is normal. CNII-XII grossly in tact. Psych:  Responds to questions appropriately with a normal affect.   Labs: Results for orders placed in visit on 01/29/12  GLUCOSE, POCT (MANUAL RESULT ENTRY)      Component Value Range   POC Glucose 287 (*) 70 - 99 mg/dl   Non-fasting sample, compared to the above fasting samples.   ASSESSMENT AND PLAN:  31 y.o. year old male with improving hyperglycemia and diabetes mellitus. -Humalog 10 units now -Stop Glipizide -Continue with Lantus, increase to 14 units tonight, follow by 2 units every 2 days, until under 200, then advance slowly -Hypoglycemia risks discussed  -Continue other meds -Recheck early next week -Healthy diet and exercise -Call Dr. Verl Dicker office to schedule office visit  Signed, Eula Listen, PA-C 01/29/2012 4:02 PM

## 2012-02-03 ENCOUNTER — Ambulatory Visit (INDEPENDENT_AMBULATORY_CARE_PROVIDER_SITE_OTHER): Payer: 59 | Admitting: Physician Assistant

## 2012-02-03 VITALS — BP 122/84 | HR 74 | Temp 98.3°F | Resp 16 | Ht 69.25 in | Wt 262.4 lb

## 2012-02-03 DIAGNOSIS — R7309 Other abnormal glucose: Secondary | ICD-10-CM

## 2012-02-03 DIAGNOSIS — R739 Hyperglycemia, unspecified: Secondary | ICD-10-CM

## 2012-02-03 DIAGNOSIS — IMO0001 Reserved for inherently not codable concepts without codable children: Secondary | ICD-10-CM

## 2012-02-03 LAB — GLUCOSE, POCT (MANUAL RESULT ENTRY): POC Glucose: 313 mg/dl — AB (ref 70–99)

## 2012-02-03 MED ORDER — INSULIN LISPRO 100 UNIT/ML ~~LOC~~ SOLN
5.0000 [IU] | Freq: Once | SUBCUTANEOUS | Status: AC
  Administered 2012-02-03: 5 [IU] via SUBCUTANEOUS

## 2012-02-03 NOTE — Progress Notes (Signed)
Patient ID: Sean Rogers MRN: [id number], DOB: 03/15/1981, 31 y.o. Date of Encounter: 02/03/2012, 11:38 AM  Primary Physician: No primary provider on file.  Chief Complaint: Follow up diabetes  HPI: 31 y.o. year old male with history below presents for follow up of diabetes/hyperglycemia secondary to stopping his medications. See previous office visits. Continues to feel better and improve. Fasting blood sugars have plateaued in the 240's. Evening sugars usually in the 300's secondary to increased snaking. Injecting Lantus 14 units QHS, did not increase these past few days. Has stopped Glipizide. Headaches have resolved. Energy almost back to baseline. Wants to begin to work out again. Denies any polydipsia, polyphagia, polyuria, or nocturia. No chest pain, SOB, wheezing, dyspnea, palpitations, or edema.    Past Medical History  Diagnosis Date  . Diabetes mellitus   . Obesity   . Hyperlipidemia   . Hypertension      Home Meds: Prior to Admission medications   Medication Sig Start Date End Date Taking? Authorizing Provider  aspirin 81 MG tablet Take 160 mg by mouth daily.   Yes Historical Provider, MD  glipiZIDE (GLUCOTROL) 5 MG tablet Take 1 tablet (5 mg total) by mouth 2 (two) times daily before a meal. 01/25/12  No Raymon Mutton Zackaria Burkey, PA-C  glucose blood test strip Use as instructed 01/29/12 01/28/13 Yes Alnisa Hasley M Welma Mccombs, PA-C  insulin glargine (LANTUS SOLOSTAR) 100 UNIT/ML injection Inject 10 Units into the skin at bedtime. 01/25/12 01/24/13 Yes Lillard Bailon M Vannary Greening, PA-C  lisinopril-hydrochlorothiazide (PRINZIDE,ZESTORETIC) 20-12.5 MG per tablet Take 1 tablet by mouth daily.   Yes Historical Provider, MD  metFORMIN (GLUCOPHAGE) 1000 MG tablet Take 1 tablet (1,000 mg total) by mouth 2 (two) times daily with a meal. 01/25/12  Yes Malcolm Quast M Symeon Puleo, PA-C  OVER THE COUNTER MEDICATION B12 take one daily   Yes Historical Provider, MD  OVER THE COUNTER MEDICATION OTC allergy relief 10 mg   Yes Historical Provider, MD    simvastatin (ZOCOR) 40 MG tablet Take 40 mg by mouth at bedtime.   Yes Historical Provider, MD    Allergies: No Known Allergies  History   Social History  . Marital Status: Single    Spouse Name: N/A    Number of Children: N/A  . Years of Education: N/A   Occupational History  . Not on file.   Social History Main Topics  . Smoking status: Never Smoker   . Smokeless tobacco: Not on file  . Alcohol Use: No  . Drug Use: No  . Sexually Active: Yes   Other Topics Concern  . Not on file   Social History Narrative  . No narrative on file     Review of Systems: Constitutional: negative for chills, fever, night sweats, weight changes, or fatigue  HEENT: negative for vision changes, hearing loss, congestion, rhinorrhea, ST, epistaxis, or sinus pressure Cardiovascular: negative for chest pain or palpitations Respiratory: negative for hemoptysis, wheezing, shortness of breath, or cough Abdominal: negative for abdominal pain, nausea, vomiting, diarrhea, or constipation Dermatological: negative for rash Neurologic: negative for headache, dizziness, or syncope All other systems reviewed and are otherwise negative with the exception to those above and in the HPI.   Physical Exam: Blood pressure 122/84, pulse 74, temperature 98.3 F (36.8 C), temperature source Oral, resp. rate 16, height 5' 9.25" (1.759 m), weight 262 lb 6.4 oz (119.024 kg), SpO2 98.00%., Body mass index is 38.47 kg/(m^2). General: Well developed, well nourished, in no acute distress. Head: Normocephalic, atraumatic,  eyes without discharge, sclera non-icteric, nares are without discharge. Bilateral auditory canals clear, TM's are without perforation, pearly grey and translucent with reflective cone of light bilaterally. Oral cavity moist, posterior pharynx without exudate, erythema, peritonsillar abscess, or post nasal drip.  Neck: Supple. No thyromegaly. Full ROM. No lymphadenopathy. Lungs: Clear bilaterally to  auscultation without wheezes, rales, or rhonchi. Breathing is unlabored. Heart: RRR with S1 S2. No murmurs, rubs, or gallops appreciated. Abdomen: Soft, non-tender, non-distended with normoactive bowel sounds. No hepatomegaly. No rebound/guarding. No obvious abdominal masses. Msk:  Strength and tone normal for age. Extremities/Skin: Warm and dry. No clubbing or cyanosis. No edema. No rashes or suspicious lesions. Neuro: Alert and oriented X 3. Moves all extremities spontaneously. Gait is normal. CNII-XII grossly in tact. Psych:  Responds to questions appropriately with a normal affect.   Labs: Results for orders placed in visit on 02/03/12  GLUCOSE, POCT (MANUAL RESULT ENTRY)      Component Value Range   POC Glucose 313 (*) 70 - 99 mg/dl     ASSESSMENT AND PLAN:  31 y.o. year old male with slowly improving hyperglycemia/diabetes -Slowly improving -Humalog 5 units in office -Continue to increase Lantus by one unit each night until a fasting blood sugar of 120-130 is reached -Continue remaining medications -Recheck one month, sooner if needed -Continue with cardiology referral for risk stratification   Signed, Eula Listen, PA-C 02/03/2012 11:38 AM

## 2012-03-07 ENCOUNTER — Ambulatory Visit: Payer: Self-pay | Admitting: Physician Assistant

## 2012-03-14 ENCOUNTER — Ambulatory Visit (INDEPENDENT_AMBULATORY_CARE_PROVIDER_SITE_OTHER): Payer: 59 | Admitting: Physician Assistant

## 2012-03-14 ENCOUNTER — Encounter: Payer: Self-pay | Admitting: Physician Assistant

## 2012-03-14 VITALS — BP 136/82 | HR 75 | Temp 98.1°F | Resp 16 | Ht 69.0 in | Wt 271.4 lb

## 2012-03-14 DIAGNOSIS — I10 Essential (primary) hypertension: Secondary | ICD-10-CM

## 2012-03-14 DIAGNOSIS — E119 Type 2 diabetes mellitus without complications: Secondary | ICD-10-CM

## 2012-03-14 LAB — HEMOGLOBIN A1C: Mean Plasma Glucose: 232 mg/dL — ABNORMAL HIGH (ref <117)

## 2012-03-14 NOTE — Progress Notes (Signed)
Patient ID: Sean Rogers MRN: [id number], DOB: 1981/06/19, 31 y.o. Date of Encounter: 03/14/2012, 4:33 PM  Primary Physician: No primary provider on file.  Chief Complaint: Diabetes follow up  HPI: 31 y.o. year old male with history below presents for follow up of diabetes mellitus. Doing well. No issues or complaints. Taking medications daily without adverse effects. Up to 25 units of Lantus each night now. Blood sugars have leveled out around the low one-teens. Feels much better. No polydipsia, polyphagia, polyuria, or nocturia.    Past Medical History  Diagnosis Date  . Diabetes mellitus   . Obesity   . Hyperlipidemia   . Hypertension      Home Meds: Prior to Admission medications   Medication Sig Start Date End Date Taking? Authorizing Provider  aspirin 81 MG tablet Take 160 mg by mouth daily.   Yes Historical Provider, MD  glucose blood test strip Use as instructed 01/29/12 01/28/13 Yes Yeraldy Spike M Molina Hollenback, PA-C  insulin glargine (LANTUS SOLOSTAR) 100 UNIT/ML injection Inject 10 Units into the skin at bedtime. 01/25/12 01/24/13 Yes Dalin Caldera M Pamlea Finder, PA-C  lisinopril-hydrochlorothiazide (PRINZIDE,ZESTORETIC) 20-12.5 MG per tablet Take 1 tablet by mouth daily.   Yes Historical Provider, MD  metFORMIN (GLUCOPHAGE) 1000 MG tablet Take 1 tablet (1,000 mg total) by mouth 2 (two) times daily with a meal. 01/25/12  Yes Versia Mignogna M Chaden Doom, PA-C  OVER THE COUNTER MEDICATION B12 take one daily   Yes Historical Provider, MD  OVER THE COUNTER MEDICATION OTC allergy relief 10 mg   Yes Historical Provider, MD  simvastatin (ZOCOR) 40 MG tablet Take 40 mg by mouth at bedtime.   Yes Historical Provider, MD    Allergies: No Known Allergies  History   Social History  . Marital Status: Single    Spouse Name: N/A    Number of Children: N/A  . Years of Education: N/A   Occupational History  . Not on file.   Social History Main Topics  . Smoking status: Never Smoker   . Smokeless tobacco: Not on file  .  Alcohol Use: No  . Drug Use: No  . Sexually Active: Yes   Other Topics Concern  . Not on file   Social History Narrative  . No narrative on file     Review of Systems: Constitutional: negative for chills, fever, night sweats, weight changes, or fatigue  HEENT: negative for vision changes, hearing loss, congestion, rhinorrhea, or epistaxis Cardiovascular: negative for chest pain, palpitations, diaphoresis, DOE, orthopnea, or edema Respiratory: negative for hemoptysis, wheezing, shortness of breath, dyspnea, or cough Abdominal: negative for abdominal pain, nausea, vomiting, diarrhea, or constipation Dermatological: negative for rash, erythema, or wounds Neurologic: negative for headache, dizziness, or syncope All other systems reviewed and are otherwise negative with the exception to those above and in the HPI.   Physical Exam: Blood pressure 136/82, pulse 75, temperature 98.1 F (36.7 C), temperature source Oral, resp. rate 16, height 5\' 9"  (1.753 m), weight 271 lb 6.4 oz (123.106 kg), SpO2 100.00%., Body mass index is 40.08 kg/(m^2). General: Well developed, well nourished, in no acute distress. Head: Normocephalic, atraumatic, eyes without discharge, sclera non-icteric, nares are without discharge. Bilateral auditory canals clear, TM's are without perforation, pearly grey and translucent with reflective cone of light bilaterally. Oral cavity moist, posterior pharynx without exudate, erythema, peritonsillar abscess, or post nasal drip.  Neck: Supple. No thyromegaly. Full ROM. No lymphadenopathy. Lungs: Clear bilaterally to auscultation without wheezes, rales, or rhonchi. Breathing is unlabored.  Heart: RRR with S1 S2. No murmurs, rubs, or gallops appreciated. Abdomen: Soft, non-tender, non-distended with normoactive bowel sounds. No hepatosplenomegaly. No rebound/guarding. No obvious abdominal masses. Msk:  Strength and tone normal for age. Extremities/Skin: Warm and dry. No clubbing  or cyanosis. No edema. No rashes, wounds, or suspicious lesions. Monofilament exam unremarkable bilaterally.  Neuro: Alert and oriented X 3. Moves all extremities spontaneously. Gait is normal. CNII-XII grossly in tact. Psych:  Responds to questions appropriately with a normal affect.   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

## 2012-03-15 LAB — BASIC METABOLIC PANEL
Chloride: 106 mEq/L (ref 96–112)
Creat: 0.99 mg/dL (ref 0.50–1.35)

## 2012-05-02 ENCOUNTER — Ambulatory Visit: Payer: Managed Care, Other (non HMO)

## 2012-05-02 ENCOUNTER — Ambulatory Visit (INDEPENDENT_AMBULATORY_CARE_PROVIDER_SITE_OTHER): Payer: Managed Care, Other (non HMO) | Admitting: Family Medicine

## 2012-05-02 VITALS — BP 130/72 | HR 76 | Temp 98.9°F | Resp 18 | Ht 70.0 in | Wt 278.0 lb

## 2012-05-02 DIAGNOSIS — R7309 Other abnormal glucose: Secondary | ICD-10-CM

## 2012-05-02 DIAGNOSIS — R079 Chest pain, unspecified: Secondary | ICD-10-CM

## 2012-05-02 DIAGNOSIS — IMO0001 Reserved for inherently not codable concepts without codable children: Secondary | ICD-10-CM

## 2012-05-02 DIAGNOSIS — R739 Hyperglycemia, unspecified: Secondary | ICD-10-CM

## 2012-05-02 LAB — POCT CBC
Granulocyte percent: 60.6 %G (ref 37–80)
HCT, POC: 45.4 % (ref 43.5–53.7)
Hemoglobin: 14 g/dL — AB (ref 14.1–18.1)
Lymph, poc: 2.5 (ref 0.6–3.4)
MCH, POC: 29.1 pg (ref 27–31.2)
MCHC: 30.8 g/dL — AB (ref 31.8–35.4)
MCV: 94.4 fL (ref 80–97)
MID (cbc): 0.3 (ref 0–0.9)
MPV: 9 fL (ref 0–99.8)
POC Granulocyte: 4.3 (ref 2–6.9)
POC LYMPH PERCENT: 34.8 %L (ref 10–50)
POC MID %: 4.6 %M (ref 0–12)
Platelet Count, POC: 257 10*3/uL (ref 142–424)
RBC: 4.81 M/uL (ref 4.69–6.13)
RDW, POC: 13.5 %
WBC: 7.1 10*3/uL (ref 4.6–10.2)

## 2012-05-02 LAB — GLUCOSE, POCT (MANUAL RESULT ENTRY): POC Glucose: 146 mg/dl — AB (ref 70–99)

## 2012-05-02 MED ORDER — GLUCOSE BLOOD VI STRP: ORAL_STRIP | Status: AC

## 2012-05-02 MED ORDER — KETOPROFEN 75 MG PO CAPS: 75.0000 mg | ORAL_CAPSULE | Freq: Three times a day (TID) | ORAL | Status: DC | PRN

## 2012-05-02 NOTE — Progress Notes (Signed)
31 yo man with left chest pain intermittently beginning last night around 7:30 pm.  He was able to sleep.  When he awoke he was still a little achey and then the sharp pains began to return intermittently throughout the day. Has taken no meds No recent unusual activities lately. No leg pains or shortness of breath  F/Hx:  Positive  For diabetes, cholesterol, hypertension  Works at [employer] at [employer]  Objective:  NAD  HEENT:  Negative Chest:  Clear, nontender Heart:  Regular, no murmur Ext:  Nontender, no edema  Neck:  Supple, no adenopathy  CXR: [facility] reading (PRIMARY) by  Dr. Milus Glazier  Normal EKG:  NSR Results for orders placed in visit on 05/02/12  GLUCOSE, POCT (MANUAL RESULT ENTRY)      Component Value Range   POC Glucose 146 (*) 70 - 99 mg/dl  POCT CBC      Component Value Range   WBC 7.1  4.6 - 10.2 K/uL   Lymph, poc 2.5  0.6 - 3.4   POC LYMPH PERCENT 34.8  10 - 50 %L   MID (cbc) 0.3  0 - 0.9   POC MID % 4.6  0 - 12 %M   POC Granulocyte 4.3  2 - 6.9   Granulocyte percent 60.6  37 - 80 %G   RBC 4.81  4.69 - 6.13 M/uL   Hemoglobin 14.0 (*) 14.1 - 18.1 g/dL   HCT, POC 16.1  09.6 - 53.7 %   MCV 94.4  80 - 97 fL   MCH, POC 29.1  27 - 31.2 pg   MCHC 30.8 (*) 31.8 - 35.4 g/dL   RDW, POC 04.5     Platelet Count, POC 257  142 - 424 K/uL   MPV 9.0  0 - 99.8 fL     Assessment:  Chest wall pain, atypical and consistent with muscle spasms  Plan:  Ketoprofen 75 tid x 5 days

## 2012-05-02 NOTE — Patient Instructions (Signed)
Chest Wall Pain Chest wall pain is pain in or around the bones and muscles of your chest. It may take up to 6 weeks to get better. It may take longer if you must stay physically active in your work and activities.  CAUSES  Chest wall pain may happen on its own. However, it may be caused by:  A viral illness like the flu.  Injury.  Coughing.  Exercise.  Arthritis.  Fibromyalgia.  Shingles. HOME CARE INSTRUCTIONS   Avoid overtiring physical activity. Try not to strain or perform activities that cause pain. This includes any activities using your chest or your abdominal and side muscles, especially if heavy weights are used.  Put ice on the sore area.  Put ice in a plastic bag.  Place a towel between your skin and the bag.  Leave the ice on for 15 to 20 minutes per hour while awake for the first 2 days.  Only take over-the-counter or prescription medicines for pain, discomfort, or fever as directed by your caregiver. SEEK IMMEDIATE MEDICAL CARE IF:   Your pain increases, or you are very uncomfortable.  You have a fever.  Your chest pain becomes worse.  You have new, unexplained symptoms.  You have nausea or vomiting.  You feel sweaty or lightheaded.  You have a cough with phlegm (sputum), or you cough up blood. MAKE SURE YOU:   Understand these instructions.  Will watch your condition.  Will get help right away if you are not doing well or get worse. Document Released: 07/13/2005 Document Revised: 10/05/2011 Document Reviewed: 03/09/2011 [employer] Patient Information 2013 [employer].  

## 2012-05-19 NOTE — Progress Notes (Signed)
This encounter was created in error - please disregard.  This encounter was created in error - please disregard.

## 2012-06-13 ENCOUNTER — Ambulatory Visit (INDEPENDENT_AMBULATORY_CARE_PROVIDER_SITE_OTHER): Payer: Managed Care, Other (non HMO) | Admitting: Physician Assistant

## 2012-06-13 ENCOUNTER — Encounter: Payer: Self-pay | Admitting: Physician Assistant

## 2012-06-13 VITALS — BP 136/88 | HR 75 | Temp 97.9°F | Resp 16 | Ht 68.75 in | Wt 273.8 lb

## 2012-06-13 DIAGNOSIS — Z23 Encounter for immunization: Secondary | ICD-10-CM

## 2012-06-13 DIAGNOSIS — IMO0001 Reserved for inherently not codable concepts without codable children: Secondary | ICD-10-CM

## 2012-06-13 LAB — POCT GLYCOSYLATED HEMOGLOBIN (HGB A1C): Hemoglobin A1C: 6.3

## 2012-06-13 LAB — GLUCOSE, POCT (MANUAL RESULT ENTRY): POC Glucose: 113 mg/dl — AB (ref 70–99)

## 2012-06-13 MED ORDER — LISINOPRIL-HYDROCHLOROTHIAZIDE 20-12.5 MG PO TABS: 1.0000 | ORAL_TABLET | Freq: Every day | ORAL | Status: DC

## 2012-06-13 MED ORDER — METFORMIN HCL 1000 MG PO TABS: 1000.0000 mg | ORAL_TABLET | Freq: Two times a day (BID) | ORAL | Status: DC

## 2012-06-13 NOTE — Progress Notes (Signed)
Patient ID: Sean Rogers MRN: [id number], DOB: [birth year], 31 y.o. Date of Encounter: 06/13/2012, 4:38 PM  Primary Physician: Elvina Sidle, MD  Chief Complaint: Diabetes follow up  HPI: 31 y.o. year old male with history below presents for follow up of diabetes mellitus. Doing well. No issues or complaints. Has been out of his oral medications for one month secondary to finances. He did stretch out his Lantus until about one week ago by injecting this every other day. He has now been out of Lantus for one week. He has not been checking his blood sugars secondary to the above also. In the setting of the above he has been off setting the lack of medications with strict dietary guidelines and increased water intake. Has been eating healthy meals, pushing water intake, and exercising more regularly. Has joined [employer] and has lost 12 pounds. No polydipsia, polyphagia, polyuria, or nocturia.   No further chest pain episodes that brought him in for an office visit back on 05/02/12. He did not get the Ketoprofen filled secondary to finances. He denies any chest pain, shortness of breath, wheezing, dyspnea, orthopnea, tachypnea, palpitations, or edema.    Last A1C: 9.7 on 03/14/12  Influenza vaccine: requests today Pneumococcal vaccine: 05/04/2011 Last CPE: 05/04/2011  Plans are to get married in February 2014. Excited about this. He is currently also taking online courses through [facility] [employer] [facility].   Past Medical History  Diagnosis Date  . Diabetes mellitus   . Obesity   . Hyperlipidemia   . Hypertension      Home Meds: Prior to Admission medications   Medication Sig Start Date End Date Taking? Authorizing Provider  glucose blood test strip Use as instructed 05/02/12 05/02/13 No Elvina Sidle, MD  insulin glargine (LANTUS SOLOSTAR) 100 UNIT/ML injection Inject 10 Units into the skin at bedtime. 01/25/12 01/24/13 No Nazareth Kirk M Laramie Gelles, PA-C  lisinopril-hydrochlorothiazide  (PRINZIDE,ZESTORETIC) 20-12.5 MG per tablet Take 1 tablet by mouth daily.   No Historical Provider, MD  metFORMIN (GLUCOPHAGE) 1000 MG tablet Take 1 tablet (1,000 mg total) by mouth 2 (two) times daily with a meal. 01/25/12  No Mechille Varghese M Zoria Rawlinson, PA-C  ketoprofen (ORUDIS) 75 MG capsule Take 1 capsule (75 mg total) by mouth 3 (three) times daily as needed for pain. 05/02/12  No Elvina Sidle, MD    Allergies: No Known Allergies  History   Social History  . Marital Status: Single    Spouse Name: N/A    Number of Children: N/A  . Years of Education: N/A   Occupational History  . Not on file.   Social History Main Topics  . Smoking status: Never Smoker   . Smokeless tobacco: Not on file  . Alcohol Use: No  . Drug Use: No  . Sexually Active: Yes   Other Topics Concern  . Not on file   Social History Narrative  . No narrative on file     Review of Systems: Constitutional: negative for chills, fever, night sweats, weight changes, or fatigue  HEENT: negative for vision changes, hearing loss, congestion, rhinorrhea, or epistaxis Cardiovascular: negative for chest pain, palpitations, diaphoresis, DOE, orthopnea, or edema Respiratory: negative for hemoptysis, wheezing, shortness of breath, dyspnea, or cough Abdominal: negative for abdominal pain, nausea, vomiting, diarrhea, or constipation Dermatological: negative for rash, erythema, or wounds Neurologic: negative for headache, dizziness, or syncope All other systems reviewed and are otherwise negative with the exception to those above and in the HPI.   Physical Exam: Blood pressure  136/88, pulse 75, temperature 97.9 F (36.6 C), temperature source Oral, resp. rate 16, height 5' 8.75" (1.746 m), weight 273 lb 12.8 oz (124.195 kg), SpO2 99.00%., Body mass index is 40.73 kg/(m^2). General: Well developed, well nourished, in no acute distress. Head: Normocephalic, atraumatic, eyes without discharge, sclera non-icteric, nares are without  discharge. Bilateral auditory canals clear, TM's are without perforation, pearly grey and translucent with reflective cone of light bilaterally. Oral cavity moist, posterior pharynx without exudate, erythema, peritonsillar abscess, or post nasal drip.  Neck: Supple. No thyromegaly. Full ROM. No lymphadenopathy. No bruits.  Lungs: Clear bilaterally to auscultation without wheezes, rales, or rhonchi. Breathing is unlabored. Heart: RRR with S1 S2. No murmurs, rubs, or gallops appreciated. Msk:  Strength and tone normal for age. Extremities/Skin: Warm and dry. No clubbing or cyanosis. No edema. No rashes, wounds, or suspicious lesions. Monofilament exam unremarkable bilaterally.  Neuro: Alert and oriented X 3. Moves all extremities spontaneously. Gait is normal. CNII-XII grossly in tact. Psych:  Responds to questions appropriately with a normal affect.   Labs: Results for orders placed in visit on 06/13/12  GLUCOSE, POCT (MANUAL RESULT ENTRY)      Component Value Range   POC Glucose 113 (*) 70 - 99 mg/dl  POCT GLYCOSYLATED HEMOGLOBIN (HGB A1C)      Component Value Range   Hemoglobin A1C 6.3     Defer send out lab secondary to finances at this time. Renal and liver function previously unremarkable.   ASSESSMENT AND PLAN:  31 y.o. year old male with diabetes mellitus and resolved chest pain. 1. Diabetes mellitus -Well controlled -Continue strict dietary guidelines -If at home blood sugars are higher than 150 please RTC -Restart Metformin 500 mg 1 po bid #180 RF 1 -Restart Lisinopril/HCTZ 20/12.5 mg 1 po daily #90 RF 1 -Plan to restart statin at next office visit if finances are back in shape -Exercise -Weight loss -Influenza vaccine given today -Recheck 3 months  2. Chest pain -See office visit from 05/02/12 -Resolved -RTC/ER if symptoms return   Signed, Eula Listen, PA-C 06/13/2012 4:38 PM

## 2012-08-27 DIAGNOSIS — I428 Other cardiomyopathies: Secondary | ICD-10-CM

## 2012-08-27 HISTORY — DX: Other cardiomyopathies: I42.8

## 2012-12-05 ENCOUNTER — Ambulatory Visit: Payer: Self-pay | Admitting: Physician Assistant

## 2012-12-26 ENCOUNTER — Encounter: Payer: Self-pay | Admitting: Physician Assistant

## 2012-12-26 ENCOUNTER — Ambulatory Visit (INDEPENDENT_AMBULATORY_CARE_PROVIDER_SITE_OTHER): Payer: BC Managed Care – PPO | Admitting: Physician Assistant

## 2012-12-26 VITALS — BP 133/81 | HR 125 | Temp 98.9°F | Resp 16 | Ht 69.5 in | Wt 241.8 lb

## 2012-12-26 DIAGNOSIS — E119 Type 2 diabetes mellitus without complications: Secondary | ICD-10-CM

## 2012-12-26 DIAGNOSIS — IMO0001 Reserved for inherently not codable concepts without codable children: Secondary | ICD-10-CM

## 2012-12-26 DIAGNOSIS — R7309 Other abnormal glucose: Secondary | ICD-10-CM

## 2012-12-26 DIAGNOSIS — R739 Hyperglycemia, unspecified: Secondary | ICD-10-CM

## 2012-12-26 LAB — POCT GLYCOSYLATED HEMOGLOBIN (HGB A1C): Hemoglobin A1C: >14

## 2012-12-26 LAB — GLUCOSE, POCT (MANUAL RESULT ENTRY)

## 2012-12-26 MED ORDER — METFORMIN HCL 1000 MG PO TABS: 1000.0000 mg | ORAL_TABLET | Freq: Two times a day (BID) | ORAL | Status: DC

## 2012-12-26 MED ORDER — LISINOPRIL-HYDROCHLOROTHIAZIDE 20-12.5 MG PO TABS: 1.0000 | ORAL_TABLET | Freq: Every day | ORAL | Status: DC

## 2012-12-26 MED ORDER — INSULIN GLARGINE 100 UNIT/ML ~~LOC~~ SOLN: 10.0000 [IU] | Freq: Every day | SUBCUTANEOUS | Status: DC

## 2012-12-26 NOTE — Progress Notes (Signed)
Patient ID: Sean Rogers MRN: [id number], DOB: 1981/04/29, 32 y.o. Date of Encounter: 12/26/2012, 3:59 PM  Primary Physician: Elvina Sidle, MD  Chief Complaint: Diabetes follow up  HPI: 32 y.o. male with history below presents for follow up of diabetes mellitus. Currently taking metformin 1000 mg bid and cinnamon 500 mg daily. Has not taken Lantus since November 2013. Has increased his exercise routine since I last saw him. Exercising daily. Eating fruits and vegetables for snacks. Does note that sometimes after a workout routine he will feel a little like his blood sugar is low, but he has not checked it. He has had to drink some OJ once after a work out routine because he felt like it was too low. States his blood sugars at home range in the "150" range with an occasional "250." He has had an intentional weight loss of 35-40 pounds secondary to his work out routine he states. He feels the best he has felt in a long time. He has been drinking a lot of water lately and has been getting some more headaches. Increased urinary frequency and nocturia.   Last A1C: 6.3% in November 2013   Past Medical History  Diagnosis Date  . Diabetes mellitus   . Obesity   . Hyperlipidemia   . Hypertension      Home Meds: Prior to Admission medications   Medication Sig Start Date End Date Taking? Authorizing Provider  Cinnamon 500 MG capsule Take 500 mg by mouth daily.   Yes Historical Provider, MD  glucose blood test strip Use as instructed 05/02/12 05/02/13 Yes Elvina Sidle, MD  lisinopril-hydrochlorothiazide (ZESTORETIC) 20-12.5 MG per tablet Take 1 tablet by mouth daily. 06/13/12  Yes Ryan M Dunn, PA-C  metFORMIN (GLUCOPHAGE) 1000 MG tablet Take 1 tablet (1,000 mg total) by mouth 2 (two) times daily with a meal. 06/13/12  Yes Ryan M Dunn, PA-C  simvastatin (ZOCOR) 10 MG tablet Take 10 mg by mouth at bedtime.   Yes Historical Provider, MD  insulin glargine (LANTUS SOLOSTAR) 100 UNIT/ML injection  Inject 10 Units into the skin at bedtime. 01/25/12 01/24/13 No Ryan M Dunn, PA-C  ketoprofen (ORUDIS) 75 MG capsule Take 1 capsule (75 mg total) by mouth 3 (three) times daily as needed for pain. 05/02/12  No Elvina Sidle, MD    Allergies: No Known Allergies  History   Social History  . Marital Status: Single    Spouse Name: N/A    Number of Children: N/A  . Years of Education: N/A   Occupational History  . Not on file.   Social History Main Topics  . Smoking status: Never Smoker   . Smokeless tobacco: Not on file  . Alcohol Use: No  . Drug Use: No  . Sexually Active: Yes   Other Topics Concern  . Not on file   Social History Narrative  . No narrative on file     Review of Systems: Constitutional: positive for weight loss. negative for chills, fever, night sweats, or fatigue  HEENT: negative for vision changes, hearing loss, congestion, rhinorrhea, or epistaxis Cardiovascular: negative for chest pain, chest tightness, palpitations, diaphoresis, DOE, orthopnea, or edema Respiratory: negative for hemoptysis, wheezing, shortness of breath, dyspnea, or cough Abdominal: negative for abdominal pain, nausea, vomiting, diarrhea, or constipation Dermatological: negative for rash, erythema, or wounds Neurologic: positive for headache. negative for dizziness, or syncope All other systems reviewed and are otherwise negative with the exception to those above and in the HPI.   Physical  Exam: Blood pressure 133/81, pulse 125, temperature 98.9 F (37.2 C), temperature source Oral, resp. rate 16, height 5' 9.5" (1.765 m), weight 241 lb 12.8 oz (109.68 kg), SpO2 99.00%., Body mass index is 35.21 kg/(m^2). General: Well developed, well nourished, in no acute distress. Head: Normocephalic, atraumatic, eyes without discharge, sclera non-icteric, nares are without discharge. Bilateral auditory canals clear, TM's are without perforation, pearly grey and translucent with reflective cone of light  bilaterally. Oral cavity moist, posterior pharynx without exudate, erythema, peritonsillar abscess, or post nasal drip.  Neck: Supple. No thyromegaly. Full ROM. No lymphadenopathy. Lungs: Clear bilaterally to auscultation without wheezes, rales, or rhonchi. Breathing is unlabored. Heart: RRR with S1 S2. No murmurs, rubs, or gallops appreciated. Msk:  Strength and tone normal for age. Extremities/Skin: Warm and dry. No clubbing or cyanosis. No edema. No rashes, wounds, or suspicious lesions. Monofilament exam unremarkable bilaterally.  Neuro: Alert and oriented X 3. Moves all extremities spontaneously. Gait is normal. CNII-XII grossly in tact. Psych:  Responds to questions appropriately with a normal affect.   Labs: Results for orders placed in visit on 12/26/12  GLUCOSE, POCT (MANUAL RESULT ENTRY)      Result Value Range   POC Glucose HHH  70 - 99 mg/dl  POCT GLYCOSYLATED HEMOGLOBIN (HGB A1C)      Result Value Range   Hemoglobin A1C >14.0%     Last A1C was 6.3% in November 2013.   CBC and CMP pending  ASSESSMENT AND PLAN:  32 y.o. male with uncontrolled insulin dependent diabetes mellitus, HTN, and HL.  1) IDDM -Poorly controlled -Restart Lantus 10 units qhs #1 RF 6 -Titrate up 2 units every 3-4 nights until routine fasting blood glucose of 120 or recheck in 1 month -Metformin 1000 mg 1 po bid #60 RF 1 -Close monitoring of blood glucose for hypoglycemia -Advised patient of the seriousness of the above -Discussed with Dr. Neva Seat  2) HTN -Continue current medications -Lisinopril/HCTZ 20/12.5 mg 1 po daily #30 RF 1 -Healthy diet and exercise  3) HL -Continue current medication -Call for refill, or I will refill at follow up in 1 month  Signed, Eula Listen, PA-C 12/26/2012 3:59 PM

## 2012-12-27 ENCOUNTER — Telehealth: Payer: Self-pay | Admitting: Emergency Medicine

## 2012-12-27 LAB — CBC
Hemoglobin: 14.4 g/dL (ref 13.0–17.0)
MCH: 29.7 pg (ref 26.0–34.0)
MCV: 88.9 fL (ref 78.0–100.0)
Platelets: 200 10*3/uL (ref 150–400)
RBC: 4.85 MIL/uL (ref 4.22–5.81)

## 2012-12-27 LAB — COMPREHENSIVE METABOLIC PANEL
CO2: 25 mEq/L (ref 19–32)
Creat: 1.04 mg/dL (ref 0.50–1.35)
Glucose, Bld: 617 mg/dL (ref 70–99)
Total Bilirubin: 0.6 mg/dL (ref 0.3–1.2)

## 2012-12-27 NOTE — Telephone Encounter (Signed)
Spoke with patient who states he is feeling well, completely asymptomatic.  He has not yet picked up medication from the pharmacy yet because he does not have the money.  He has not checked his blood sugar today.  Asked him to check his sugar for me while we were on the phone, but pt states his meter is at home and he is at work now.  Instructed pt to pick up medication ASAP.  Instructed him to check his blood sugar ASAP and call me back.

## 2012-12-27 NOTE — Telephone Encounter (Signed)
I received a call from [facility] this morning the patient's glucose was 617. We'll have our physician assistant call patient this morning for status check and make adjustments on his Lantus at that time.

## 2012-12-28 ENCOUNTER — Telehealth: Payer: Self-pay

## 2012-12-28 ENCOUNTER — Telehealth: Payer: Self-pay | Admitting: Physician Assistant

## 2012-12-28 MED ORDER — "PEN NEEDLES 3/16"" 31G X 5 MM MISC": 1.0000 | Freq: Every day | Status: DC

## 2012-12-28 MED ORDER — INSULIN GLARGINE 100 UNIT/ML SOLOSTAR PEN: 10.0000 [IU] | PEN_INJECTOR | Freq: Every day | SUBCUTANEOUS | Status: DC

## 2012-12-28 NOTE — Telephone Encounter (Signed)
Received fax from pharmacy asking for clarification of # vials of Lantus. Ryan had written for 5 pens, so am resending Rx for [employer] and pen needles after speaking with Lanora Manis.

## 2012-12-28 NOTE — Telephone Encounter (Signed)
Called patient to get an update on his status. He is at the pharmacy currently to pick up his Lantus. He will start this tonight. States finances are an issue. He does have insurance. He currently feels fine. He does not currently have any test strips. I have asked him to talk with the pharmacist about the cheapest option for test strips and glucometer, he will do so. I have told him that I will call back for an update with the above. Our call was spontaneously disconnected, repeat calls were sent to voicemail.   I would like to discuss the above with him one-on-one.   Eula Listen, PA-C 12/28/2012 5:24 PM

## 2012-12-29 ENCOUNTER — Telehealth: Payer: Self-pay | Admitting: Physician Assistant

## 2012-12-29 ENCOUNTER — Telehealth: Payer: Self-pay | Admitting: Radiology

## 2012-12-29 MED ORDER — GLUCOSE BLOOD VI STRP: ORAL_STRIP | Status: DC

## 2012-12-29 NOTE — Telephone Encounter (Signed)
LMOM for patient to CB with update.  

## 2012-12-29 NOTE — Telephone Encounter (Signed)
Called patient back to discuss the above with him. He is doing well. Feels fine. Has started his Lantus. On 10 units qhs. He will titrate up as directed. He is driving to picking up test strips as we speak. He will look into cheaper options with his insurance and get back with me including 3 month plans. He will follow up as directed.

## 2012-12-29 NOTE — Telephone Encounter (Signed)
Patient called back, last pm his sugar was 350 last pm. Was 327 today he states he needs the strips for the relion meter these are sent in for him.

## 2012-12-30 ENCOUNTER — Telehealth: Payer: Self-pay

## 2012-12-30 MED ORDER — BLOOD GLUCOSE METER KIT: PACK | Status: DC

## 2012-12-30 MED ORDER — GLUCOSE BLOOD VI STRP: ORAL_STRIP | Status: DC

## 2012-12-30 NOTE — Telephone Encounter (Signed)
Pharm sent PA req for Relion test strips. [employer] will not cover unless there is some reason pt can not use Bayer contour or One Touch. Checked w/Ryan and am sending in new Rx for meter and strips. LMOM for pt to CB to notify of change.

## 2012-12-30 NOTE — Telephone Encounter (Signed)
Pt CB and I advised him of the new Rx for meter and strips that will be covered by his insurance. Pt agreed.

## 2013-02-25 ENCOUNTER — Other Ambulatory Visit: Payer: Self-pay | Admitting: Physician Assistant

## 2013-04-27 ENCOUNTER — Other Ambulatory Visit: Payer: Self-pay | Admitting: Physician Assistant

## 2013-08-29 ENCOUNTER — Observation Stay (HOSPITAL_COMMUNITY)
Admission: EM | Admit: 2013-08-29 | Discharge: 2013-09-01 | Disposition: A | Discharge status: Home / Self Care | Payer: BC Managed Care – PPO | Attending: Internal Medicine | Admitting: Internal Medicine

## 2013-08-29 ENCOUNTER — Encounter (HOSPITAL_COMMUNITY): Payer: Self-pay | Admitting: Emergency Medicine

## 2013-08-29 ENCOUNTER — Emergency Department (HOSPITAL_COMMUNITY): Payer: BC Managed Care – PPO

## 2013-08-29 DIAGNOSIS — E119 Type 2 diabetes mellitus without complications: Secondary | ICD-10-CM | POA: Insufficient documentation

## 2013-08-29 DIAGNOSIS — R51 Headache: Secondary | ICD-10-CM

## 2013-08-29 DIAGNOSIS — Z6837 Body mass index (BMI) 37.0-37.9, adult: Secondary | ICD-10-CM | POA: Insufficient documentation

## 2013-08-29 DIAGNOSIS — R0789 Other chest pain: Secondary | ICD-10-CM | POA: Insufficient documentation

## 2013-08-29 DIAGNOSIS — I10 Essential (primary) hypertension: Secondary | ICD-10-CM

## 2013-08-29 DIAGNOSIS — Z794 Long term (current) use of insulin: Secondary | ICD-10-CM | POA: Insufficient documentation

## 2013-08-29 DIAGNOSIS — R9431 Abnormal electrocardiogram [ECG] [EKG]: Secondary | ICD-10-CM

## 2013-08-29 DIAGNOSIS — I429 Cardiomyopathy, unspecified: Secondary | ICD-10-CM

## 2013-08-29 DIAGNOSIS — R0600 Dyspnea, unspecified: Secondary | ICD-10-CM

## 2013-08-29 DIAGNOSIS — G43909 Migraine, unspecified, not intractable, without status migrainosus: Secondary | ICD-10-CM | POA: Insufficient documentation

## 2013-08-29 DIAGNOSIS — E785 Hyperlipidemia, unspecified: Secondary | ICD-10-CM

## 2013-08-29 DIAGNOSIS — I428 Other cardiomyopathies: Principal | ICD-10-CM | POA: Insufficient documentation

## 2013-08-29 DIAGNOSIS — R079 Chest pain, unspecified: Secondary | ICD-10-CM

## 2013-08-29 DIAGNOSIS — R0609 Other forms of dyspnea: Secondary | ICD-10-CM | POA: Insufficient documentation

## 2013-08-29 DIAGNOSIS — E669 Obesity, unspecified: Secondary | ICD-10-CM | POA: Insufficient documentation

## 2013-08-29 DIAGNOSIS — R0989 Other specified symptoms and signs involving the circulatory and respiratory systems: Secondary | ICD-10-CM | POA: Insufficient documentation

## 2013-08-29 DIAGNOSIS — R519 Headache, unspecified: Secondary | ICD-10-CM

## 2013-08-29 LAB — CBC WITH DIFFERENTIAL/PLATELET
BASOS ABS: 0 10*3/uL (ref 0.0–0.1)
Basophils Relative: 0 % (ref 0–1)
Eosinophils Absolute: 0.1 10*3/uL (ref 0.0–0.7)
Eosinophils Relative: 2 % (ref 0–5)
HEMATOCRIT: 38.3 % — AB (ref 39.0–52.0)
Hemoglobin: 13.4 g/dL (ref 13.0–17.0)
LYMPHS PCT: 37 % (ref 12–46)
Lymphs Abs: 2.8 10*3/uL (ref 0.7–4.0)
MCH: 30.5 pg (ref 26.0–34.0)
MCHC: 35 g/dL (ref 30.0–36.0)
MCV: 87.2 fL (ref 78.0–100.0)
MONO ABS: 0.5 10*3/uL (ref 0.1–1.0)
Monocytes Relative: 6 % (ref 3–12)
NEUTROS PCT: 56 % (ref 43–77)
Neutro Abs: 4.2 10*3/uL (ref 1.7–7.7)
Platelets: 223 10*3/uL (ref 150–400)
RBC: 4.39 MIL/uL (ref 4.22–5.81)
RDW: 12.5 % (ref 11.5–15.5)
WBC: 7.6 10*3/uL (ref 4.0–10.5)

## 2013-08-29 LAB — CBC
HCT: 38.2 % — ABNORMAL LOW (ref 39.0–52.0)
Hemoglobin: 13.5 g/dL (ref 13.0–17.0)
MCH: 30.9 pg (ref 26.0–34.0)
MCHC: 35.3 g/dL (ref 30.0–36.0)
MCV: 87.4 fL (ref 78.0–100.0)
PLATELETS: 213 10*3/uL (ref 150–400)
RBC: 4.37 MIL/uL (ref 4.22–5.81)
RDW: 12.5 % (ref 11.5–15.5)
WBC: 8.1 10*3/uL (ref 4.0–10.5)

## 2013-08-29 LAB — BASIC METABOLIC PANEL
BUN: 13 mg/dL (ref 6–23)
CHLORIDE: 102 meq/L (ref 96–112)
CO2: 26 mEq/L (ref 19–32)
CREATININE: 0.99 mg/dL (ref 0.50–1.35)
Calcium: 8.9 mg/dL (ref 8.4–10.5)
GFR calc Af Amer: >90 mL/min (ref >90)
GFR calc non Af Amer: >90 mL/min (ref >90)
Glucose, Bld: 209 mg/dL — ABNORMAL HIGH (ref 70–99)
POTASSIUM: 4.2 meq/L (ref 3.7–5.3)
SODIUM: 139 meq/L (ref 137–147)

## 2013-08-29 LAB — MAGNESIUM: Magnesium: 2.2 mg/dL (ref 1.5–2.5)

## 2013-08-29 LAB — CREATININE, SERUM
Creatinine, Ser: 1.04 mg/dL (ref 0.50–1.35)
GFR calc Af Amer: >90 mL/min (ref >90)
GFR calc non Af Amer: >90 mL/min (ref >90)

## 2013-08-29 LAB — PHOSPHORUS: PHOSPHORUS: 3.7 mg/dL (ref 2.3–4.6)

## 2013-08-29 LAB — POCT I-STAT TROPONIN I: Troponin i, poc: 0 ng/mL (ref 0.00–0.08)

## 2013-08-29 LAB — PRO B NATRIURETIC PEPTIDE: PRO B NATRI PEPTIDE: 145.9 pg/mL — AB (ref 0–125)

## 2013-08-29 LAB — GLUCOSE, CAPILLARY: Glucose-Capillary: 180 mg/dL — ABNORMAL HIGH (ref 70–99)

## 2013-08-29 LAB — D-DIMER, QUANTITATIVE (NOT AT ARMC): D DIMER QUANT: 0.27 ug{FEU}/mL (ref 0.00–0.48)

## 2013-08-29 LAB — TROPONIN I: Troponin I: <0.3 ng/mL (ref <0.30)

## 2013-08-29 MED ORDER — SIMVASTATIN 40 MG PO TABS
40.0000 mg | ORAL_TABLET | Freq: Every day | ORAL | Status: DC
  Administered 2013-08-29 – 2013-09-01 (×4): 40 mg via ORAL
  Filled 2013-08-29 (×4): qty 1

## 2013-08-29 MED ORDER — INSULIN GLARGINE 100 UNIT/ML ~~LOC~~ SOLN
20.0000 [IU] | Freq: Every day | SUBCUTANEOUS | Status: DC
  Administered 2013-08-29 – 2013-08-31 (×3): 20 [IU] via SUBCUTANEOUS
  Filled 2013-08-29 (×4): qty 0.2

## 2013-08-29 MED ORDER — SODIUM CHLORIDE 0.9 % IV SOLN
INTRAVENOUS | Status: AC
  Administered 2013-08-29: 23:00:00 via INTRAVENOUS

## 2013-08-29 MED ORDER — LISINOPRIL 20 MG PO TABS
20.0000 mg | ORAL_TABLET | Freq: Every day | ORAL | Status: DC
  Administered 2013-08-29 – 2013-09-01 (×4): 20 mg via ORAL
  Filled 2013-08-29 (×4): qty 1

## 2013-08-29 MED ORDER — LISINOPRIL-HYDROCHLOROTHIAZIDE 20-12.5 MG PO TABS: 1.0000 | ORAL_TABLET | Freq: Every day | ORAL | Status: DC

## 2013-08-29 MED ORDER — METOPROLOL TARTRATE 25 MG PO TABS
25.0000 mg | ORAL_TABLET | Freq: Two times a day (BID) | ORAL | Status: DC
  Administered 2013-08-29 – 2013-08-30 (×2): 25 mg via ORAL
  Filled 2013-08-29 (×3): qty 1

## 2013-08-29 MED ORDER — INSULIN ASPART 100 UNIT/ML ~~LOC~~ SOLN
0.0000 [IU] | Freq: Three times a day (TID) | SUBCUTANEOUS | Status: DC
  Administered 2013-08-30: 3 [IU] via SUBCUTANEOUS
  Administered 2013-09-01: 2 [IU] via SUBCUTANEOUS

## 2013-08-29 MED ORDER — ACETAMINOPHEN 325 MG PO TABS
650.0000 mg | ORAL_TABLET | ORAL | Status: DC | PRN
Start: 2013-08-29 — End: 2013-09-01
  Administered 2013-08-30: 650 mg via ORAL
  Filled 2013-08-29: qty 2

## 2013-08-29 MED ORDER — HYDROCHLOROTHIAZIDE 12.5 MG PO CAPS
12.5000 mg | ORAL_CAPSULE | Freq: Every day | ORAL | Status: DC
  Administered 2013-08-29 – 2013-08-30 (×2): 12.5 mg via ORAL
  Filled 2013-08-29 (×4): qty 1

## 2013-08-29 MED ORDER — ONDANSETRON HCL 4 MG/2ML IJ SOLN: 4.0000 mg | Freq: Four times a day (QID) | INTRAMUSCULAR | Status: DC | PRN

## 2013-08-29 MED ORDER — NITROGLYCERIN 0.4 MG SL SUBL
0.4000 mg | SUBLINGUAL_TABLET | SUBLINGUAL | Status: DC | PRN
  Administered 2013-08-29: 0.4 mg via SUBLINGUAL
  Filled 2013-08-29: qty 25

## 2013-08-29 MED ORDER — NITROGLYCERIN 0.4 MG SL SUBL: 0.4000 mg | SUBLINGUAL_TABLET | SUBLINGUAL | Status: DC | PRN

## 2013-08-29 MED ORDER — MORPHINE SULFATE 2 MG/ML IJ SOLN: 2.0000 mg | INTRAMUSCULAR | Status: DC | PRN

## 2013-08-29 MED ORDER — HEPARIN SODIUM (PORCINE) 5000 UNIT/ML IJ SOLN
5000.0000 [IU] | Freq: Three times a day (TID) | INTRAMUSCULAR | Status: DC
  Administered 2013-08-29 – 2013-09-01 (×6): 5000 [IU] via SUBCUTANEOUS
  Filled 2013-08-29 (×11): qty 1

## 2013-08-29 MED ORDER — GI COCKTAIL ~~LOC~~
30.0000 mL | Freq: Four times a day (QID) | ORAL | Status: DC | PRN
Start: 2013-08-29 — End: 2013-09-01

## 2013-08-29 MED ORDER — ASPIRIN EC 325 MG PO TBEC
325.0000 mg | DELAYED_RELEASE_TABLET | Freq: Every day | ORAL | Status: DC
  Administered 2013-08-29 – 2013-08-31 (×3): 325 mg via ORAL
  Filled 2013-08-29 (×2): qty 1

## 2013-08-29 NOTE — ED Notes (Signed)
According to EMS, Patient was at [facility] singing and he started to experience chest pain there.  He did not think anything of it.  The pain was intermittent through today.  Patient was at work today, he began to feel chest pain, Nausea and dizziness and they called EMS patient denied any other symptoms..  EMS placed an IV gave him one SL nitro, 324 of Aspirin, and 4mg  of Zofran patient was diaphoretic, SOB when EMS got there.   Patient was transported here to be evaluated.  Patient intially rated the chest pain 3/10, he is chest pain free now.

## 2013-08-29 NOTE — Progress Notes (Signed)
Unit CM UR Completed by [facility] ED CM  W. Kenyetta Fife RN  

## 2013-08-29 NOTE — ED Provider Notes (Signed)
CSN: [id number]     Arrival date & time 08/29/13  1611 History   First MD Initiated Contact with Patient 08/29/13 1619     Chief Complaint  Patient presents with  . Chest Pain   (Consider location/radiation/quality/duration/timing/severity/associated sxs/prior Treatment) HPI Ziv Welchel Mcglocklin is a 33 y.o. male who presents to emergency department complaining of chest pain, shortness of breath, dizziness. Patient states his symptoms began 4 days ago while at church. Patient states he was singing and dancing and began feeling "worn out" and dizzy and short of breath. He states he had some chest tightness. He states he went home and was feeling fatigued and had heart racing all evening. He states he took it easy the next day and states he did nothing but rest at home which improved his symptoms. He states that he has not been feeling back to 100% in today he states he was at work and was lifting some heavy boxes when he began feeling dizzy again, states had some mild chest tightness, had generalized weakness, became short of breath and diaphoretic. When EMS arrived patient was diaphoretic. They gave him 325 mg of aspirin, 4 mg of Zofran, and 2 sublingual nitroglycerin which improved his symptoms. Patient states that he did feel like his heart was racing. He states that he does have history of diabetes and hypertension. Also states has elevated cholesterol. He denies any prior heart problems. He denies any family history of heart problems. He denies any prior similar symptoms.   Past Medical History  Diagnosis Date  . Diabetes mellitus   . Obesity   . Hyperlipidemia   . Hypertension    Past Surgical History  Procedure Laterality Date  . Hip pinning      high school football injury (left hip)   Family History  Problem Relation Age of Onset  . Diabetes Mother   . Hypertension Mother   . Hyperlipidemia Mother   . Diabetes Sister   . Diabetes Maternal Uncle   . Cancer Maternal Grandmother     History  Substance Use Topics  . Smoking status: Never Smoker   . Smokeless tobacco: Not on file  . Alcohol Use: No    Review of Systems  Constitutional: Positive for diaphoresis. Negative for fever and chills.  Respiratory: Positive for chest tightness and shortness of breath. Negative for cough.   Cardiovascular: Positive for chest pain and palpitations. Negative for leg swelling.  Gastrointestinal: Positive for nausea. Negative for vomiting, abdominal pain, diarrhea and abdominal distention.  Genitourinary: Negative for dysuria, urgency, frequency and hematuria.  Musculoskeletal: Negative for arthralgias, myalgias, neck pain and neck stiffness.  Skin: Negative for rash.  Allergic/Immunologic: Negative for immunocompromised state.  Neurological: Positive for dizziness and light-headedness. Negative for weakness, numbness and headaches.  All other systems reviewed and are negative.    Allergies  Review of patient's allergies indicates no known allergies.  Home Medications   Current Outpatient Rx  Name  Route  Sig  Dispense  Refill  . Blood Glucose Monitoring Suppl (BLOOD GLUCOSE METER) kit      Use to test blood sugar three times daily. Dx code: 250.00.   1 each   0     FILL WITH ONE TOUCH METER   . Cinnamon 500 MG capsule   Oral   Take 500 mg by mouth daily.         Marland Kitchen glucose blood (RELION GLUCOSE TEST STRIPS) test strip      Use as instructed  100 each   12   . glucose blood (ULTIMA TEST) test strip      Use to check blood sugar 3 times daily. Due for office visit   300 each   0     Due for office visit   . glucose blood test strip      Use as instructed   100 each   12     To check blood sugars tid, dx code 250.00. FILL WI .Marland Kitchen.   . Insulin Glargine (LANTUS SOLOSTAR) 100 UNIT/ML SOPN   Subcutaneous   Inject 10 Units into the skin at bedtime. Advance by 2 units every 3-4 days until fasting blood sugar is 120 or below.   5 pen   5   . insulin  glargine (LANTUS) 100 UNIT/ML injection   Subcutaneous   Inject 0.1 mLs (10 Units total) into the skin at bedtime. Advance by 2 units every 3-4 days   5 pen   6   . Insulin Pen Needle (PEN NEEDLES 3/16") 31G X 5 MM MISC   Subcutaneous   Inject 1 each into the skin daily.   100 each   1   . ketoprofen (ORUDIS) 75 MG capsule   Oral   Take 1 capsule (75 mg total) by mouth 3 (three) times daily as needed for pain.   30 capsule   0   . lisinopril-hydrochlorothiazide (PRINZIDE,ZESTORETIC) 20-12.5 MG per tablet      TAKE ONE TABLET BY MOUTH EVERY DAY   30 tablet   0     Needs follow up visit.   . metFORMIN (GLUCOPHAGE) 1000 MG tablet   Oral   Take 1 tablet (1,000 mg total) by mouth 2 (two) times daily with a meal.   180 tablet   1   . simvastatin (ZOCOR) 10 MG tablet   Oral   Take 10 mg by mouth at bedtime.         . simvastatin (ZOCOR) 40 MG tablet      TAKE ONE TABLET BY MOUTH AT BEDTIME   30 tablet   0     Needs follow up visit    BP 154/65  Pulse 106  Temp(Src) 98.2 F (36.8 C) (Oral)  Resp 19  SpO2 100% Physical Exam  Nursing note and vitals reviewed. Constitutional: He appears well-developed and well-nourished. No distress.  HENT:  Head: Normocephalic and atraumatic.  Eyes: Conjunctivae are normal.  Neck: Neck supple.  Cardiovascular: Regular rhythm and normal heart sounds.   Tachycardic. Pulses are irregular  Pulmonary/Chest: Effort normal. No respiratory distress. He has no wheezes. He has no rales.  Abdominal: Soft. Bowel sounds are normal. He exhibits no distension. There is no tenderness. There is no rebound.  Musculoskeletal: He exhibits no edema.  Neurological: He is alert.  Skin: Skin is warm and dry.    ED Course  Procedures (including critical care time) Labs Review Labs Reviewed  CBC WITH DIFFERENTIAL - Abnormal; Notable for the following:    HCT 38.3 (*)    All other components within normal limits  BASIC METABOLIC PANEL -  Abnormal; Notable for the following:    Glucose, Bld 209 (*)    All other components within normal limits  PRO B NATRIURETIC PEPTIDE - Abnormal; Notable for the following:    Pro B Natriuretic peptide (BNP) 145.9 (*)    All other components within normal limits  CBC - Abnormal; Notable for the following:    HCT 38.2 (*)  All other components within normal limits  GLUCOSE, CAPILLARY - Abnormal; Notable for the following:    Glucose-Capillary 180 (*)    All other components within normal limits  D-DIMER, QUANTITATIVE  CREATININE, SERUM  TROPONIN I  MAGNESIUM  PHOSPHORUS  HEMOGLOBIN A1C  TROPONIN I  TROPONIN I  TSH  POCT I-STAT TROPONIN I   Imaging Review Dg Chest 2 View  08/29/2013   CLINICAL DATA:  Chest pain.  EXAM: CHEST  2 VIEW  COMPARISON:  05/02/2012.  FINDINGS: Poor inspiration. Lungs are clear. No pleural effusion or pneumothorax. Stable heart size, no pulmonary venous congestion. No acute bony abnormality .  IMPRESSION: No active cardiopulmonary disease.   Electronically Signed   By: Marcello Moores  Register   On: 08/29/2013 17:20      MDM   1. Chest pain   2. Abnormal ECG   3. Diabetes mellitus   4. Hyperlipidemia   5. Hypertension   6. Obesity   7. Type 2 diabetes mellitus     Patient with 2 episodes of now exertional chest pain, with associated shortness of breath, diaphoresis, dizziness, nausea. He does have multiple risk factors for possible coronary disease including hypertension, obesity, hyperlipidemia, diabetes. He's pain went away with nitroglycerin. EKG and workup so far is unremarkable. Given his concerning symptoms, will admit for oxygen rule out. His EKG did show several ectopic beats, both atrial and ventricular. Will continue to monitor. Patient is chest pain-free in ED.  Filed Vitals:   08/29/13 1930 08/29/13 1945 08/29/13 2000 08/29/13 2036  BP: 121/71 120/70 116/71 130/82  Pulse: 92 72 86 85  Temp:    98.5 F (36.9 C)  TempSrc:      Resp: _0 Height:    _1  (1.778 m)  SpO2: 98% 99% 97% 100%       Demeka Sutter A Zackerie Sara, PA-C 08/30/13 0045

## 2013-08-29 NOTE — ED Notes (Signed)
Patient transported to X-ray 

## 2013-08-29 NOTE — H&P (Signed)
[facility] History and Physical  Sean Rogers OVF:[id number] DOB: Jun 29, 1981 DOA: 08/29/2013  Referring physician: PA: Lemont Fillers PCP: Elvina Sidle, MD   Chief Complaint: Chest discomfort, heart flutter  HPI: Sean Rogers is a 33 y.o. male  With history of diabetes mellitus on Lantus for 2 years and metformin, hypertension, hyperlipidemia. States that he has been having chest discomfort on and off for the last 4 days. The discomfort is described as an ache located in his left chest which travels his left arm. The patient states that exerting himself makes it worse. He is not sure what makes it better but he reports that when he rests he thinks it goes away. The discomfort can last anywhere from 30 minutes to an hour when it comes on. The patient also reports that at times he feels his heart flutter and he can feel that in his neck as well. No jaw pain reported however.  Given patient's risk factors we were consult at for further evaluation and recommendations. While in the ED patient had chest x-ray which reported no active cardiopulmonary disease, i-STAT troponin was negative, and WBC count was within normal limits.   Review of Systems:  Constitutional:  No weight loss, night sweats, Fevers, chills, fatigue.  HEENT:  No headaches, Difficulty swallowing,Tooth/dental problems,Sore throat,  No sneezing, itching, ear ache, nasal congestion, post nasal drip,  Cardio-vascular:  + chest pain, Orthopnea, PND, swelling in lower extremities, anasarca, dizziness,+ palpitations  GI:  No heartburn, indigestion, abdominal pain, nausea, vomiting, diarrhea, change in bowel habits, loss of appetite  Resp:  No shortness of breath with exertion or at rest. No excess mucus, no productive cough, No non-productive cough, No coughing up of blood.No change in color of mucus.No wheezing.No chest wall deformity  Skin:  no rash or lesions.  GU:  no dysuria, change in color of urine, no urgency or  frequency. No flank pain.  Musculoskeletal:  No joint pain or swelling. No decreased range of motion. No back pain.  Psych:  No change in mood or affect. No depression or anxiety. No memory loss.   Past Medical History  Diagnosis Date  . Diabetes mellitus   . Obesity   . Hyperlipidemia   . Hypertension    Past Surgical History  Procedure Laterality Date  . Hip pinning      high school football injury (left hip)   Social History:  reports that he has never smoked. He does not have any smokeless tobacco history on file. He reports that he does not drink alcohol or use illicit drugs.  No Known Allergies  Family History  Problem Relation Age of Onset  . Diabetes Mother   . Hypertension Mother   . Hyperlipidemia Mother   . Diabetes Sister   . Diabetes Maternal Uncle   . Cancer Maternal Grandmother      Prior to Admission medications   Medication Sig Start Date End Date Taking? Authorizing Provider  insulin glargine (LANTUS) 100 UNIT/ML injection Inject 20 Units into the skin at bedtime.   Yes Historical Provider, MD  lisinopril-hydrochlorothiazide (PRINZIDE,ZESTORETIC) 20-12.5 MG per tablet Take 1 tablet by mouth daily.   Yes Historical Provider, MD  metFORMIN (GLUCOPHAGE) 1000 MG tablet Take 1,000 mg by mouth 2 (two) times daily with a meal.   Yes Historical Provider, MD  simvastatin (ZOCOR) 40 MG tablet Take 40 mg by mouth daily.   Yes Historical Provider, MD   Physical Exam: Filed Vitals:   08/29/13 1632  BP: 154/65  Pulse: 106  Temp: 98.2 F (36.8 C)  Resp: 19    BP 154/65  Pulse 106  Temp(Src) 98.2 F (36.8 C) (Oral)  Resp 19  SpO2 100%  General:  Appears calm and comfortable Eyes: PERRL, normal lids, irises & conjunctiva ENT: grossly normal hearing, lips & tongue Neck: no LAD, masses or thyromegaly Cardiovascular: Normal S1 and S2 with rapid rate, no m/r/g. No LE edema. Respiratory: CTA bilaterally, no w/r/r. Normal respiratory effort. Abdomen: soft,  ntnd Skin: no rash or induration seen on limited exam Musculoskeletal: grossly normal tone BUE/BLE Psychiatric: grossly normal mood and affect, speech fluent and appropriate Neurologic: grossly non-focal.          Labs on Admission:  Basic Metabolic Panel:  Recent Labs Lab 08/29/13 1640  NA 139  K 4.2  CL 102  CO2 26  GLUCOSE 209*  BUN 13  CREATININE 0.99  CALCIUM 8.9   Liver Function Tests: No results found for this basename: AST, ALT, ALKPHOS, BILITOT, PROT, ALBUMIN,  in the last 168 hours No results found for this basename: LIPASE, AMYLASE,  in the last 168 hours No results found for this basename: AMMONIA,  in the last 168 hours CBC:  Recent Labs Lab 08/29/13 1640  WBC 7.6  NEUTROABS 4.2  HGB 13.4  HCT 38.3*  MCV 87.2  PLT 223   Cardiac Enzymes: No results found for this basename: CKTOTAL, CKMB, CKMBINDEX, TROPONINI,  in the last 168 hours  BNP (last 3 results)  Recent Labs  08/29/13 1640  PROBNP 145.9*   CBG: No results found for this basename: GLUCAP,  in the last 168 hours  Radiological Exams on Admission: Dg Chest 2 View  08/29/2013   CLINICAL DATA:  Chest pain.  EXAM: CHEST  2 VIEW  COMPARISON:  05/02/2012.  FINDINGS: Poor inspiration. Lungs are clear. No pleural effusion or pneumothorax. Stable heart size, no pulmonary venous congestion. No acute bony abnormality .  IMPRESSION: No active cardiopulmonary disease.   Electronically Signed   By: Maisie Fushomas  Register   On: 08/29/2013 17:20    EKG: Independently reviewed. Patient has sinus tachycardia with PVCs no ST elevations or depressions  Assessment/Plan Active Problems:   Chest discomfort (principal problem) - Will admit to telemetry floor for monitoring chest pain rule out - Aspirin daily while ruling out - Morphine and nitroglycerin when necessary chest discomfort - It is reasonable with this patient to consider exercise stress test - Heart healthy diet  Diabetes mellitus - Will hold  metformin while in house - Continue Lantus - Diabetic diet - Routine CBG monitoring  Hyperlipidemia - Stable continue simvastatin  Hypertension - Will continue patient's lisinopril and hydrochlorothiazide - Will add beta blocker given elevated heart rate and blood pressure  Obesity -Will recommend weight loss and dieting  DVT prophylaxis -Heparin  Code Status: full Family Communication: discussed with patient and spouse at bedside Disposition Plan: Ending chest pain rule out  Time spent: > 55 minutes  Penny PiaVEGA, Taraoluwa Thakur [facility] Pager [phone]

## 2013-08-30 DIAGNOSIS — R079 Chest pain, unspecified: Secondary | ICD-10-CM | POA: Diagnosis present

## 2013-08-30 DIAGNOSIS — R0600 Dyspnea, unspecified: Secondary | ICD-10-CM | POA: Diagnosis present

## 2013-08-30 DIAGNOSIS — I428 Other cardiomyopathies: Secondary | ICD-10-CM

## 2013-08-30 DIAGNOSIS — R0609 Other forms of dyspnea: Secondary | ICD-10-CM | POA: Diagnosis present

## 2013-08-30 DIAGNOSIS — R9431 Abnormal electrocardiogram [ECG] [EKG]: Secondary | ICD-10-CM

## 2013-08-30 DIAGNOSIS — E669 Obesity, unspecified: Secondary | ICD-10-CM

## 2013-08-30 DIAGNOSIS — I10 Essential (primary) hypertension: Secondary | ICD-10-CM

## 2013-08-30 DIAGNOSIS — E119 Type 2 diabetes mellitus without complications: Secondary | ICD-10-CM

## 2013-08-30 DIAGNOSIS — R0789 Other chest pain: Secondary | ICD-10-CM

## 2013-08-30 DIAGNOSIS — R0989 Other specified symptoms and signs involving the circulatory and respiratory systems: Secondary | ICD-10-CM

## 2013-08-30 DIAGNOSIS — E785 Hyperlipidemia, unspecified: Secondary | ICD-10-CM

## 2013-08-30 DIAGNOSIS — R072 Precordial pain: Secondary | ICD-10-CM

## 2013-08-30 HISTORY — PX: OTHER SURGICAL HISTORY: SHX169

## 2013-08-30 LAB — GLUCOSE, CAPILLARY
GLUCOSE-CAPILLARY: 126 mg/dL — AB (ref 70–99)
Glucose-Capillary: 133 mg/dL — ABNORMAL HIGH (ref 70–99)
Glucose-Capillary: 190 mg/dL — ABNORMAL HIGH (ref 70–99)

## 2013-08-30 LAB — TSH: TSH: 2.582 u[IU]/mL (ref 0.350–4.500)

## 2013-08-30 LAB — HEMOGLOBIN A1C
Hgb A1c MFr Bld: 7.3 % — ABNORMAL HIGH (ref <5.7)
Mean Plasma Glucose: 163 mg/dL — ABNORMAL HIGH (ref <117)

## 2013-08-30 LAB — TROPONIN I

## 2013-08-30 MED ORDER — SODIUM CHLORIDE 0.9 % IV SOLN
INTRAVENOUS | Status: DC
  Administered 2013-08-31: 06:00:00 via INTRAVENOUS

## 2013-08-30 MED ORDER — SODIUM CHLORIDE 0.9 % IJ SOLN
3.0000 mL | Freq: Two times a day (BID) | INTRAMUSCULAR | Status: DC
  Administered 2013-08-30: 3 mL via INTRAVENOUS

## 2013-08-30 MED ORDER — SODIUM CHLORIDE 0.9 % IV SOLN
250.0000 mL | INTRAVENOUS | Status: DC | PRN
Start: 2013-08-30 — End: 2013-08-31

## 2013-08-30 MED ORDER — CARVEDILOL 3.125 MG PO TABS
3.1250 mg | ORAL_TABLET | Freq: Two times a day (BID) | ORAL | Status: DC
  Administered 2013-08-31 – 2013-09-01 (×2): 3.125 mg via ORAL
  Filled 2013-08-30 (×5): qty 1

## 2013-08-30 MED ORDER — SODIUM CHLORIDE 0.9 % IJ SOLN
3.0000 mL | INTRAMUSCULAR | Status: DC | PRN
Start: 2013-08-30 — End: 2013-08-31

## 2013-08-30 NOTE — Progress Notes (Signed)
Agree Sean Rogers  

## 2013-08-30 NOTE — Progress Notes (Signed)
Pt exercised 9 minutes of Bruce. No chest pain. Test stopped secondary to fatigue. Max HR 163 > 85% APMHR.  Echo results pending.  Corine Shelter PA-C 08/30/2013 2:55 PM

## 2013-08-30 NOTE — Discharge Instructions (Signed)
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 Here is some information about cardiomyopathy. Keep in mind there are different types.  Cardiomyopathy Cardiomyopathy means a disease of the heart muscle. The heart muscle becomes enlarged or stiff. The heart is not able to pump enough blood or deliver enough oxygen to the body. This leads to heart failure and is the number one reason for heart transplants.  TYPES OF CARDIOMYOPATHY INCLUDE: DILATED  The most common type. The heart muscle is stretched out and weak so there is less blood pumped out.   Some causes:  Disease of the arteries of the heart (ischemia).  Heart attack with muscle scar.  Leaky or damaged valves.  After a viral illness.  Smoking.  High cholesterol.  Diabetes or overactive thyroid.  Alcohol or drug abuse.  High blood pressure.  May be reversible. HYPERTROPHIC The heart muscle grows bigger so there is less room for blood in the ventricle, and not enough blood is pumped out.   Causes include:  Mitral valve leaks.  Inherited tendency (from your family).  No explanation (idiopathic).  May be a cause of sudden death in young athletes with no symptoms. RESTRICTIVE The heart muscle becomes stiff, but not always larger. The heart has to work harder and will get weaker. Abnormal heart beats or rhythm (arrhythmia) are common.  Some causes:  Diseases in other parts of the body which may produce abnormal deposits in the heart muscle.  Probably not inherited.  A result of radiation treatment for cancer. SYMPTOMS OF ALL TYPES:  Less able to exercise or tolerate physical activity.  Palpitations.  Irregular heart beat, heart arrhythmias.  Shortness of breath, even at rest.  Chest pain.  Lightheadedness or fainting. TREATMENT  Life-style changes including reducing salt, lowering cholesterol, stop smoking.  Manage contributing causes with medications.  Medicines to help reduce the fluids in the body.  An  implanted cardioverter defibrillator (ICD) to improve heart function and correct arrhythmias.  Medications to relax the blood vessels and make it easier for the heart to pump.  Drugs that help regulate heart beat and improve heart relaxation, reducing the work of the heart.  Myomectomy for patients with hypertrophic cardiomyopathy and severe problems. This is a surgical procedure that removes a portion of the thickened muscle wall in order to improve heart output and provide symptom relief.  A heart transplant is an option in carefully applied circumstances. SEEK IMMEDIATE MEDICAL CARE IF:   You have severe chest pain, especially if the pain is crushing or pressure-like and spreads to the arms, back, neck, or jaw, or if you have sweating, feeling sick to your stomach (nausea), or shortness of breath. THIS IS AN EMERGENCY. Do not wait to see if the pain will go away. Get medical help at once. Call your local emergency services (911 in [place] DO NOT drive yourself to the hospital.  You develop severe shortness of breath.  You begin to cough up bloody sputum.  You are unable to sleep because you cannot breathe.  You gain weight due to fluid retention.  You develop painful swelling in your calf or leg.  You feel your heart racing and it does not go away or happens when you are resting. Document Released: 09/25/2004 Document Revised: 10/05/2011 Document Reviewed: 02/29/2008 [facility] Patient Information 2014 [place], [place].

## 2013-08-30 NOTE — Consult Note (Signed)
CARDIOLOGY CONSULTATION NOTE.  NAME:  Sean Rogers   MRN: [id number] DOB:  07/22/1981   ADMIT DATE: 08/29/2013  Reason for Consult: New diagnosis of cardiomyopathy - EF 35% by stress echo  Requesting Physician: Dr. Winifred OliveBelkys Regalodo  Primary Cardiologist: None  HPI: This is a 33 y.o. male with a past medical history significant for type 2 diabetes on insulin, hypertension, dyslipidemia, and mild obesity no significant family history of CAD, who was admitted by [facility] on February 3 after several days of exertional dyspnea. Indicate a story of being fine in his usual state of health have recovered from a viral infection that felt like flulike symptoms several weeks ago. Fully recovered and was with his wife at church on Friday evening, and after a very energetic portion of service, was quite fatigued. This level of exertion and fatigue he has not been on not usual as his wife is also fatigued, however in the morning he was extremely fatigued on this Saturday morning without any desire to do any activities. He also noted profoundly worsening exertional dyspnea. 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 The episode lasted much 30 minutes at a time and then go away. In the interim he also noted several episodes where he felt fluttering and rapid heart beats and it seemed erratic in nature. He felt somewhat lightheaded but no basilar Near syncope. He denies any TIA or amaurosis fugax symptoms. No PND orthopnea or edema, just the exertional dyspnea. He presented emergency room when he became so that we could barely stand up for more than 2 minutes.  Based on his risk of cardiac risk factor and the relatively classic  symptoms, he was scheduled for Treadmill Echocardiogram Stress Test. He stated he did relatively well wall walking on the treadmill until he got into stage III and he became profoundly dyspneic and was not able to continue. He does not have any of the chest pressure.  There were no electrocardiographic or echocardiographic findings of potential ischemia, however his ejection fraction was noted to be 35% at rest, but improved with exertion.  Based on his abnormal findings, we were consulted for assistance.  Past Medical History  Diagnosis Date  . Diabetes mellitus   . Obesity   . Hyperlipidemia   . Hypertension    Past Surgical History  Procedure Laterality Date  . Hip pinning      high school football injury (left hip)   FAMHx: Family History  Problem Relation Age of Onset  . Diabetes Mother   . Hypertension Mother   . Hyperlipidemia Mother   . Diabetes Sister   . Diabetes Maternal Uncle   . Cancer Maternal Grandmother     SOCHx:  reports that he has never smoked. He does not have any smokeless tobacco history on file. He reports that he does not drink alcohol or use illicit drugs.  ALLERGIES: No Known Allergies  ROS: Cardiac ROS positive for: chest pressure/discomfort, dyspnea, exertional chest pressure/discomfort, fatigue, irregular heart beat and palpitations A comprehensive review of systems was negative except for: Constitutional: positive for fatigue Recent viral illness that both he and his wife had with coughing congestion fatigue mild fevers and chills.  HOME MEDICATIONS: Prescriptions  prior to admission  Medication Sig Dispense Refill  . insulin glargine (LANTUS) 100 UNIT/ML injection Inject 20 Units into the skin at bedtime.      Marland Kitchen lisinopril-hydrochlorothiazide (PRINZIDE,ZESTORETIC) 20-12.5 MG per tablet Take 1 tablet by mouth daily.      . metFORMIN (GLUCOPHAGE) 1000 MG tablet Take 1,000 mg by mouth 2 (two) times daily with a meal.      . simvastatin (ZOCOR) 40  MG tablet Take 40 mg by mouth daily.        HOSPITAL MEDICATIONS: . aspirin EC  325 mg Oral Daily  . heparin  5,000 Units Subcutaneous Q8H  . lisinopril  20 mg Oral Daily   And  . hydrochlorothiazide  12.5 mg Oral Daily  . insulin aspart  0-15 Units Subcutaneous TID WC  . insulin glargine  20 Units Subcutaneous QHS  . metoprolol tartrate  25 mg Oral BID  . simvastatin  40 mg Oral Daily  . sodium chloride  3 mL Intravenous Q12H   I have reviewed the patient's current medications.  VITALS: Blood pressure 129/80, pulse 82, temperature 97.9 F (36.6 C), temperature source Oral, resp. rate 20, height 5\' 10"  (1.778 m), SpO2 98.00%.  PHYSICAL EXAM: General appearance: alert, cooperative, appears stated age, no distress, moderately obese and well-nourished and well-groomed. Neck: no adenopathy, no carotid bruit, supple, symmetrical, trachea midline and unable to really assess JVP due to body habitus. HEENT: /AT, EOMI, MMM, anicteric sclera Lungs: clear to auscultation bilaterally and normal percussion bilaterally Heart: normal apical impulse, regular rate and rhythm, S1, S2 normal and cannot exclude the presence of a soft S4 gallop but otherwise no M./R./G. Abdomen: soft, non-tender; bowel sounds normal; no masses,  no organomegaly Extremities: extremities normal, atraumatic, no cyanosis or edema, no edema, redness or tenderness in the calves or thighs and no ulcers, gangrene or trophic changes Pulses: 2+ and symmetric Skin: Skin color, texture, turgor normal. No rashes or lesions Neurologic: Alert and oriented X 3, normal strength and tone. Normal symmetric reflexes. Normal coordination and gait  LABS: Results for orders placed during the hospital encounter of 08/29/13 (from the past 24 hour(s))  GLUCOSE, CAPILLARY     Status: Abnormal   Collection Time    08/29/13  8:41 PM      Result Value Range   Glucose-Capillary 180 (*) 70 - 99 mg/dL  CBC     Status: Abnormal   Collection  Time    08/29/13  9:57 PM      Result Value Range   WBC 8.1  4.0 - 10.5 K/uL   RBC 4.37  4.22 - 5.81 MIL/uL   Hemoglobin 13.5  13.0 - 17.0 g/dL   HCT 16.1 (*) 09.6 - 04.5 %   MCV 87.4  78.0 - 100.0 fL   MCH 30.9  26.0 - 34.0 pg   MCHC 35.3  30.0 - 36.0 g/dL   RDW 40.9  81.1 - 91.4 %   Platelets 213  150 - 400 K/uL  CREATININE, SERUM     Status: None   Collection Time    08/29/13  9:57 PM      Result Value Range   Creatinine, Ser 1.04  0.50 - 1.35 mg/dL   GFR calc non Af Amer >90  >90 mL/min   GFR calc Af Amer >90  >90 mL/min  HEMOGLOBIN A1C     Status: Abnormal   Collection Time    08/29/13  9:57 PM      Result Value  Range   Hemoglobin A1C 7.3 (*) <5.7 %   Mean Plasma Glucose 163 (*) <117 mg/dL  TROPONIN I     Status: None   Collection Time    08/29/13  9:57 PM      Result Value Range   Troponin I <0.30  <0.30 ng/mL  TSH     Status: None   Collection Time    08/29/13  9:57 PM      Result Value Range   TSH 2.582  0.350 - 4.500 uIU/mL  MAGNESIUM     Status: None   Collection Time    08/29/13  9:57 PM      Result Value Range   Magnesium 2.2  1.5 - 2.5 mg/dL  PHOSPHORUS     Status: None   Collection Time    08/29/13  9:57 PM      Result Value Range   Phosphorus 3.7  2.3 - 4.6 mg/dL  TROPONIN I     Status: None   Collection Time    08/30/13  3:35 AM      Result Value Range   Troponin I <0.30  <0.30 ng/mL  TROPONIN I     Status: None   Collection Time    08/30/13  9:50 AM      Result Value Range   Troponin I <0.30  <0.30 ng/mL   IMAGING: Chest x-ray February 3, no acute disease.  IMPRESSION: Active Problems:   Chest tightness or pressure -with exertion   Chest pain with moderate risk for cardiac etiology   Dyspnea on exertion   Cardiomyopathy - unclear etiology   Type 2 diabetes mellitus   Obesity   Hypertension   Hyperlipidemia   Diabetes mellitus   RECOMMENDATION: Based on these symptoms of chest discomfort, with new diagnosis of reduced ejection  fraction, despite not having evidence of ischemia on the echocardiogram evaluation, we cannot exclude multivessel CAD. He has significant cardiac risk factors despite his age. At this point, my recommendation made to proceed with diagnostic cardiac catheterization to confirm the absence of CAD suggested by the lack of wall motion abnormalities changes on exercise echocardiogram.  Plan adjusted based on the results of the catheterization. My running diagnoses for now is that his cardiomyopathy is related to his recent illness this is a viral illness. However, it still be important to exclude coronary disease based on him having chest pressure with exertion. I spent at least 15 minutes to the patient about the procedure itself along with risks, benefits alternatives and indications. He and his wife voice understanding and agree to proceed.  He is on an ACE inhibitor which is appropriate, is also beta blocker, however I would prefer to convert to carvedilol for better treatment. He is not having active heart failure symptoms, therefore would not recommend diuretic beyond HCTZ unless his left ventricular filling pressures were elevated on cardiac catheterization.  I will make n.p.o. after midnight and have written precath orders.  Thank you each in consult, Thank you for allowing [place] to participate in the care of since she gentleman. I would happy to see him in followup post discharge. Further plans pending results of cardiac catheterization.   Time Spent Directly with Patient: 45 minutes  HARDING,DAVID W, M.D., M.S. [facility] [address] [place], [place]  [address]  [phone]  08/30/2013 10:14 PM

## 2013-08-30 NOTE — Progress Notes (Signed)
[facility] PROGRESS NOTE  Sean Rogers WUJ:[id number] DOB: [birth year] DOA: 08/29/2013 PCP: Elvina Sidle, MD  Assessment/Plan: 1. Chest pain; resolved. Stress ECHO no evidence of ischemia. New diagnosis cardio,yopathy. Ef 35 %  2. Cardiomyopathy Ef 35 % new diagnosis. Cardio consulted. Had viral infection 1 week ago. ACE when BP allows.   Code Status: Full Code.  Family Communication: care discussed with patient.  Disposition Plan: to be determine   Consultants:  Cardio  Procedures: Stress ECHO; Stress echocardiogram with no chest pain; hypertensive response; no ST changes; global hypokinesis at baseline with EF 35; improvement in LV function with stress (EF 60) with no stress induced wall motion abnormalities. Findings consistent with probable nonischemic cardiomyopathy. Bruce protocol. Stress echocardiography. Patient status: Inpatient.    Antibiotics:  none  HPI/Subjective: Chest pain has resolved. Does relates dyspnea on exertion.   Objective: Filed Vitals:   08/30/13 1345  BP: 115/74  Pulse: 60  Temp: 98.2 F (36.8 C)  Resp: 18    Intake/Output Summary (Last 24 hours) at 08/30/13 1754 Last data filed at 08/30/13 0535  Gross per 24 hour  Intake 438.67 ml  Output    800 ml  Net -361.33 ml   There were no vitals filed for this visit.  Exam:   General: no distress.   Cardiovascular: S 1, S 2 RRR  Respiratory: CTA  Abdomen: Bs present, soft, NT  Musculoskeletal: no edema.   Data Reviewed: Basic Metabolic Panel:  Recent Labs Lab 08/29/13 1640 08/29/13 2157  NA 139  --   K 4.2  --   CL 102  --   CO2 26  --   GLUCOSE 209*  --   BUN 13  --   CREATININE 0.99 1.04  CALCIUM 8.9  --   MG  --  2.2  PHOS  --  3.7   Liver Function Tests: No results found for this basename: AST, ALT, ALKPHOS, BILITOT, PROT, ALBUMIN,  in the last 168 hours No results found for this basename: LIPASE, AMYLASE,  in the last 168 hours No results found  for this basename: AMMONIA,  in the last 168 hours CBC:  Recent Labs Lab 08/29/13 1640 08/29/13 2157  WBC 7.6 8.1  NEUTROABS 4.2  --   HGB 13.4 13.5  HCT 38.3* 38.2*  MCV 87.2 87.4  PLT 223 213   Cardiac Enzymes:  Recent Labs Lab 08/29/13 2157 08/30/13 0335 08/30/13 0950  TROPONINI <0.30 <0.30 <0.30   BNP (last 3 results)  Recent Labs  08/29/13 1640  PROBNP 145.9*   CBG:  Recent Labs Lab 08/29/13 2041 08/30/13 0739 08/30/13 1120 08/30/13 1625  GLUCAP 180* 133* 126* 190*    No results found for this or any previous visit (from the past 240 hour(s)).   Studies: Dg Chest 2 View  08/29/2013   CLINICAL DATA:  Chest pain.  EXAM: CHEST  2 VIEW  COMPARISON:  05/02/2012.  FINDINGS: Poor inspiration. Lungs are clear. No pleural effusion or pneumothorax. Stable heart size, no pulmonary venous congestion. No acute bony abnormality .  IMPRESSION: No active cardiopulmonary disease.   Electronically Signed   By: Maisie Fus  Register   On: 08/29/2013 17:20    Scheduled Meds: . aspirin EC  325 mg Oral Daily  . heparin  5,000 Units Subcutaneous Q8H  . lisinopril  20 mg Oral Daily   And  . hydrochlorothiazide  12.5 mg Oral Daily  . insulin aspart  0-15 Units Subcutaneous TID WC  . insulin glargine  20 Units Subcutaneous QHS  . metoprolol tartrate  25 mg Oral BID  . simvastatin  40 mg Oral Daily   Continuous Infusions:   Active Problems:   Obesity   Hypertension   Hyperlipidemia   Chest pain   Chest discomfort   Diabetes mellitus    Time spent: 25 minutes.     Sean Rogers  [facility] Pager [phone]. If 7PM-7AM, please contact night-coverage at [email or web address], password [email or web address] 08/30/2013, 5:54 PM  LOS: 1 day

## 2013-08-31 ENCOUNTER — Observation Stay (HOSPITAL_COMMUNITY): Payer: BC Managed Care – PPO

## 2013-08-31 ENCOUNTER — Encounter (HOSPITAL_COMMUNITY): Payer: Self-pay | Admitting: Radiology

## 2013-08-31 ENCOUNTER — Encounter (HOSPITAL_COMMUNITY)
Admission: EM | Disposition: A | Discharge status: Home / Self Care | Payer: Self-pay | Source: Home / Self Care | Attending: Emergency Medicine

## 2013-08-31 DIAGNOSIS — R51 Headache: Secondary | ICD-10-CM

## 2013-08-31 DIAGNOSIS — R519 Headache, unspecified: Secondary | ICD-10-CM

## 2013-08-31 DIAGNOSIS — R079 Chest pain, unspecified: Secondary | ICD-10-CM

## 2013-08-31 HISTORY — PX: LEFT HEART CATHETERIZATION WITH CORONARY ANGIOGRAM: SHX5451

## 2013-08-31 HISTORY — PX: CARDIAC CATHETERIZATION: SHX172

## 2013-08-31 LAB — BASIC METABOLIC PANEL
BUN: 16 mg/dL (ref 6–23)
CO2: 29 mEq/L (ref 19–32)
Calcium: 9 mg/dL (ref 8.4–10.5)
Chloride: 102 mEq/L (ref 96–112)
Creatinine, Ser: 1.16 mg/dL (ref 0.50–1.35)
GFR calc Af Amer: >90 mL/min (ref >90)
GFR calc non Af Amer: 81 mL/min — ABNORMAL LOW (ref >90)
Glucose, Bld: 109 mg/dL — ABNORMAL HIGH (ref 70–99)
Potassium: 4.1 mEq/L (ref 3.7–5.3)
Sodium: 144 mEq/L (ref 137–147)

## 2013-08-31 LAB — GLUCOSE, CAPILLARY
GLUCOSE-CAPILLARY: 103 mg/dL — AB (ref 70–99)
GLUCOSE-CAPILLARY: 187 mg/dL — AB (ref 70–99)
Glucose-Capillary: 134 mg/dL — ABNORMAL HIGH (ref 70–99)
Glucose-Capillary: 147 mg/dL — ABNORMAL HIGH (ref 70–99)

## 2013-08-31 LAB — PROTIME-INR
INR: 1.08 (ref 0.00–1.49)
Prothrombin Time: 13.8 seconds (ref 11.6–15.2)

## 2013-08-31 SURGERY — LEFT HEART CATHETERIZATION WITH CORONARY ANGIOGRAM
Anesthesia: LOCAL

## 2013-08-31 MED ORDER — FENTANYL CITRATE 0.05 MG/ML IJ SOLN
INTRAMUSCULAR | Status: AC
  Filled 2013-08-31: qty 2

## 2013-08-31 MED ORDER — VERAPAMIL HCL 2.5 MG/ML IV SOLN
INTRAVENOUS | Status: AC
  Filled 2013-08-31: qty 2

## 2013-08-31 MED ORDER — HEPARIN SODIUM (PORCINE) 1000 UNIT/ML IJ SOLN
INTRAMUSCULAR | Status: AC
  Filled 2013-08-31: qty 1

## 2013-08-31 MED ORDER — NITROGLYCERIN 0.4 MG SL SUBL: 0.4000 mg | SUBLINGUAL_TABLET | SUBLINGUAL | Status: DC | PRN

## 2013-08-31 MED ORDER — STROKE: EARLY STAGES OF RECOVERY BOOK
Freq: Once | Status: AC
  Administered 2013-09-01: 07:00:00
  Filled 2013-08-31: qty 1

## 2013-08-31 MED ORDER — ONDANSETRON HCL 4 MG/2ML IJ SOLN: 4.0000 mg | Freq: Four times a day (QID) | INTRAMUSCULAR | Status: DC | PRN

## 2013-08-31 MED ORDER — SODIUM CHLORIDE 0.9 % IV SOLN: INTRAVENOUS | Status: DC

## 2013-08-31 MED ORDER — MIDAZOLAM HCL 2 MG/2ML IJ SOLN
INTRAMUSCULAR | Status: AC
  Filled 2013-08-31: qty 2

## 2013-08-31 MED ORDER — ASPIRIN EC 81 MG PO TBEC
81.0000 mg | DELAYED_RELEASE_TABLET | Freq: Every day | ORAL | Status: DC
  Administered 2013-09-01: 81 mg via ORAL
  Filled 2013-08-31: qty 1

## 2013-08-31 MED ORDER — ASPIRIN 81 MG PO TBEC: 81.0000 mg | DELAYED_RELEASE_TABLET | Freq: Every day | ORAL | Status: AC

## 2013-08-31 MED ORDER — HEPARIN (PORCINE) IN NACL 2-0.9 UNIT/ML-% IJ SOLN
INTRAMUSCULAR | Status: AC
  Filled 2013-08-31: qty 1000

## 2013-08-31 MED ORDER — NITROGLYCERIN 0.2 MG/ML ON CALL CATH LAB
INTRAVENOUS | Status: AC
  Filled 2013-08-31: qty 1

## 2013-08-31 MED ORDER — ACETAMINOPHEN 325 MG PO TABS: 650.0000 mg | ORAL_TABLET | ORAL | Status: DC | PRN

## 2013-08-31 MED ORDER — METFORMIN HCL 1000 MG PO TABS: 1000.0000 mg | ORAL_TABLET | Freq: Two times a day (BID) | ORAL | Status: DC

## 2013-08-31 MED ORDER — LIDOCAINE HCL (PF) 1 % IJ SOLN
INTRAMUSCULAR | Status: AC
  Filled 2013-08-31: qty 30

## 2013-08-31 MED ORDER — CARVEDILOL 3.125 MG PO TABS: 3.1250 mg | ORAL_TABLET | Freq: Two times a day (BID) | ORAL | Status: DC

## 2013-08-31 NOTE — Progress Notes (Signed)
SUBJECTIVE:  Had some mild CP earlier this am  OBJECTIVE:   Vitals:   Filed Vitals:   08/30/13 1345 08/30/13 1959 08/31/13 0523 08/31/13 0758  BP: 115/74 129/80 104/56   Pulse: 60 82 67   Temp: 98.2 F (36.8 C) 97.9 F (36.6 C) 97.5 F (36.4 C)   TempSrc: Oral Oral Oral   Resp: 18 20 18    Height:      Weight:    263 lb 9.6 oz (119.568 kg)  SpO2: 97% 98% 100%    I&O's:  No intake or output data in the 24 hours ending 08/31/13 0811 TELEMETRY: Reviewed telemetry pt in NSR:     PHYSICAL EXAM General: Well developed, well nourished, in no acute distress Head: Eyes PERRLA, No xanthomas.   Normal cephalic and atramatic  Lungs:   Clear bilaterally to auscultation and percussion. Heart:   HRRR S1 S2 Pulses are 2+ & equal. Abdomen: Bowel sounds are positive, abdomen soft and non-tender without masses Extremities:   No clubbing, cyanosis or edema.  DP +1 Neuro: Alert and oriented X 3. Psych:  Good affect, responds appropriately   LABS: Basic Metabolic Panel:  Recent Labs  11/57/26 1640 08/29/13 2157 08/31/13 0540  NA 139  --  144  K 4.2  --  4.1  CL 102  --  102  CO2 26  --  29  GLUCOSE 209*  --  109*  BUN 13  --  16  CREATININE 0.99 1.04 1.16  CALCIUM 8.9  --  9.0  MG  --  2.2  --   PHOS  --  3.7  --    Liver Function Tests: No results found for this basename: AST, ALT, ALKPHOS, BILITOT, PROT, ALBUMIN,  in the last 72 hours No results found for this basename: LIPASE, AMYLASE,  in the last 72 hours CBC:  Recent Labs  08/29/13 1640 08/29/13 2157  WBC 7.6 8.1  NEUTROABS 4.2  --   HGB 13.4 13.5  HCT 38.3* 38.2*  MCV 87.2 87.4  PLT 223 213   Cardiac Enzymes:  Recent Labs  08/29/13 2157 08/30/13 0335 08/30/13 0950  TROPONINI <0.30 <0.30 <0.30   BNP: No components found with this basename: POCBNP,  D-Dimer:  Recent Labs  08/29/13 1640  DDIMER 0.27   Hemoglobin A1C:  Recent Labs  08/29/13 2157  HGBA1C 7.3*   Fasting Lipid Panel: No  results found for this basename: CHOL, HDL, LDLCALC, TRIG, CHOLHDL, LDLDIRECT,  in the last 72 hours Thyroid Function Tests:  Recent Labs  08/29/13 2157  TSH 2.582   Anemia Panel: No results found for this basename: VITAMINB12, FOLATE, FERRITIN, TIBC, IRON, RETICCTPCT,  in the last 72 hours Coag Panel:   No results found for this basename: INR, PROTIME    RADIOLOGY: Dg Chest 2 View  08/29/2013   CLINICAL DATA:  Chest pain.  EXAM: CHEST  2 VIEW  COMPARISON:  05/02/2012.  FINDINGS: Poor inspiration. Lungs are clear. No pleural effusion or pneumothorax. Stable heart size, no pulmonary venous congestion. No acute bony abnormality .  IMPRESSION: No active cardiopulmonary disease.   Electronically Signed   By: Maisie Fus  Register   On: 08/29/2013 17:20   IMPRESSION:  Active Problems:  Chest tightness or pressure -with exertion  Chest pain with moderate risk for cardiac etiology  Dyspnea on exertion  Cardiomyopathy - unclear etiology ? viral Type 2 diabetes mellitus  Obesity  Hypertension  Hyperlipidemia  Diabetes mellitus   RECOMMENDATION:  1.  Based on these symptoms of chest discomfort, with new diagnosis of reduced ejection fraction, despite not having evidence of ischemia on the echocardiogram evaluation, we cannot exclude multivessel CAD. He has significant cardiac risk factors despite his age. Presumed diagnoses for now is that his cardiomyopathy is related to his recent illness this is a viral illness. However, it still be important to exclude coronary disease based on him having chest pressure with exertion and significant cardiac risk factors.   2.  Continue ACE inhibitor and beta blocker.  He is not having active heart failure symptoms, therefore would not recommend diuretic beyond HCTZ unless his left ventricular filling pressures were elevated on cardiac catheterization.           Quintella ReichertURNER,Tyshea Imel R, MD  08/31/2013  8:11 AM

## 2013-08-31 NOTE — Progress Notes (Signed)
TR band removed without complications after patient return from stat CT scan of the head.  2x2 and opsite applied, radial site level 0.  Will continue to monitor.  Sean Rogers

## 2013-08-31 NOTE — Progress Notes (Signed)
[facility] PROGRESS NOTE  Sean KrabbeDonta L Rogers DUK:[id number] DOB: 03/14/1981 DOA: 08/29/2013 PCP: Sean Rogers  Assessment/Plan: 1. Chest pain; Had an episode early this am.  Stress ECHO no evidence of ischemia. New diagnosis cardio,yopathy. Ef 35 %. Plan for Cath today.  2. Cardiomyopathy Ef 35 % new diagnosis. Cardio consulted. Had viral infection 1 week ago. On ACE, coreg. Cath this am. Disposition per cardio.  3.   Diabetes; on Lantus and SSI.   Code Status: Full Code.  Family Communication: care discussed with patient.  Disposition Plan: to be determine   Consultants:  Cardio  Procedures: Stress ECHO; Stress echocardiogram with no chest pain; hypertensive response; no ST changes; global hypokinesis at baseline with EF 35; improvement in LV function with stress (EF 60) with no stress induced wall motion abnormalities. Findings consistent with probable nonischemic cardiomyopathy. Bruce protocol. Stress echocardiography. Patient status: Inpatient.    Antibiotics:  none  HPI/Subjective: Had an episode of chest pain early this am. Chest pain free now.   Objective: Filed Vitals:   08/31/13 0523  BP: 104/56  Pulse: 67  Temp: 97.5 F (36.4 C)  Resp: 18   No intake or output data in the 24 hours ending 08/31/13 0854 Filed Weights   08/31/13 0758  Weight: 119.568 kg (263 lb 9.6 oz)    Exam:   General: no distress.   Cardiovascular: S 1, S 2 RRR  Respiratory: CTA  Abdomen: Bs present, soft, NT  Musculoskeletal: no edema.   Data Reviewed: Basic Metabolic Panel:  Recent Labs Lab 08/29/13 1640 08/29/13 2157 08/31/13 0540  NA 139  --  144  K 4.2  --  4.1  CL 102  --  102  CO2 26  --  29  GLUCOSE 209*  --  109*  BUN 13  --  16  CREATININE 0.99 1.04 1.16  CALCIUM 8.9  --  9.0  MG  --  2.2  --   PHOS  --  3.7  --    Liver Function Tests: No results found for this basename: AST, ALT, ALKPHOS, BILITOT, PROT, ALBUMIN,  in the last 168  hours No results found for this basename: LIPASE, AMYLASE,  in the last 168 hours No results found for this basename: AMMONIA,  in the last 168 hours CBC:  Recent Labs Lab 08/29/13 1640 08/29/13 2157  WBC 7.6 8.1  NEUTROABS 4.2  --   HGB 13.4 13.5  HCT 38.3* 38.2*  MCV 87.2 87.4  PLT 223 213   Cardiac Enzymes:  Recent Labs Lab 08/29/13 2157 08/30/13 0335 08/30/13 0950  TROPONINI <0.30 <0.30 <0.30   BNP (last 3 results)  Recent Labs  08/29/13 1640  PROBNP 145.9*   CBG:  Recent Labs Lab 08/29/13 2041 08/30/13 0739 08/30/13 1120 08/30/13 1625 08/31/13 0727  GLUCAP 180* 133* 126* 190* 134*    No results found for this or any previous visit (from the past 240 hour(s)).   Studies: Dg Chest 2 View  08/29/2013   CLINICAL DATA:  Chest pain.  EXAM: CHEST  2 VIEW  COMPARISON:  05/02/2012.  FINDINGS: Poor inspiration. Lungs are clear. No pleural effusion or pneumothorax. Stable heart size, no pulmonary venous congestion. No acute bony abnormality .  IMPRESSION: No active cardiopulmonary disease.   Electronically Signed   By: Maisie Fushomas  Register   On: 08/29/2013 17:20    Scheduled Meds: . aspirin EC  325 mg Oral Daily  . carvedilol  3.125 mg Oral BID WC  .  heparin  5,000 Units Subcutaneous Q8H  . lisinopril  20 mg Oral Daily   And  . hydrochlorothiazide  12.5 mg Oral Daily  . insulin aspart  0-15 Units Subcutaneous TID WC  . insulin glargine  20 Units Subcutaneous QHS  . simvastatin  40 mg Oral Daily  . sodium chloride  3 mL Intravenous Q12H   Continuous Infusions: . sodium chloride 75 mL/hr at 08/31/13 0609    Active Problems:   Type 2 diabetes mellitus   Obesity   Hypertension   Hyperlipidemia   Chest tightness or pressure -with exertion   Diabetes mellitus   Chest pain with moderate risk for cardiac etiology   Dyspnea on exertion   Cardiomyopathy - unclear etiology    Time spent: 25 minutes.     Sean Rogers  [facility] Pager  [phone]. If 7PM-7AM, please contact night-coverage at [email or web address], password [facility] 08/31/2013, 8:54 AM  LOS: 2 days

## 2013-08-31 NOTE — Progress Notes (Addendum)
Patient had transient episode of left side face numbness bellow left eye,. Symptoms has resolved. Will do frequent neuro check. Check CT head. Neuro consulted. Cancel discharge. Need to avoid hypotension./

## 2013-08-31 NOTE — CV Procedure (Signed)
Sean Rogers is a 33 y.o. male    [id number]  [id number] LOCATION:  FACILITY: [facility]  PHYSICIAN: Lennette Bihari, MD, [facility] Jun 01, 1981   DATE OF PROCEDURE:  08/31/2013    CARDIAC CATHETERIZATION     HISTORY: Mr. Sean Rogers is a 33 year old African American male who has a history of diabetes mellitus on insulin therapy, hypertension, dyslipidemia, and obesity. He was hospitalized after several days of worsening exertional dyspnea. He also developed chest heaviness and pressure. An echo Doppler study showed an ejection fraction of 35%. He now presents for definitive cardiac catheterization to exclude coronary obstructive disease.   PROCEDURE:  Catheterization was performed from the right radial approach after the patient was documented to have a positive Allen's test. Versed 2 mg and fentanyl 50 mcg were given for IV conscious sedation. His right radial artery was punctured with a through and through approach and a slender sheath was inserted. A radial cocktail was administered consisting of verapamil, nitroglycerin, and lidocaine. A 5 French right are 4 catheter was then advanced over a safety J. wire to the acing aorta selective angiography right coronary artery was performed. The left coronary artery was selectively cannulated ultimately with a 5 [place] TIG catheter after unsuccessful attempt at cannulation using an 5 [place] FL 3.5 and EZ-RAD left catheter. Left ventriculography was done utilizing a 5 French pigtail catheter. Catheters were removed and a Terumo radial band was applied at 14:50 and 14 cc. The patient tolerated procedure well and returned to his room in stable condition to  HEMODYNAMICS:   Central Aorta: 104/68   Left Ventricle: 104/12  ANGIOGRAPHY:  1. Left main: Angiographically normal vessel which bifurcated into the LAD and left circumflex coronary arteries. 2. LAD: Angiographically normal vessel which gave rise to 2 major diagonal branches and several septal  perforating arteries. The LAD extended to the LV apex. 3. Left circumflex: Angiographically normal vessel. 4. Right coronary artery: Angiographically normal large dominant right coronary artery.   Left ventriculography revealed mild LV dilatation with global hypocontractility with an ejection fraction estimate of 35-39%.   IMPRESSION:  Nonischemic cardiomyopathymyopathy with an ejection fraction of 35 to less than 40% with global hypocontractility.  Normal coronary arteries.   Lennette Bihari, MD, [facility] 08/31/2013 3:06 PM

## 2013-08-31 NOTE — Interval H&P Note (Signed)
Cath Lab Visit (complete for each Cath Lab visit)  Clinical Evaluation Leading to the Procedure:   ACS: no  Non-ACS:    Anginal Classification: CCS III  Anti-ischemic medical therapy: Maximal Therapy (2 or more classes of medications)  Non-Invasive Test Results: Low-risk stress test findings: cardiac mortality <1%/year  Prior CABG: No previous CABG      History and Physical Interval Note:  08/31/2013 1:09 PM  Sean Rogers  has presented today for surgery, with the diagnosis of cp  The various methods of treatment have been discussed with the patient and family. After consideration of risks, benefits and other options for treatment, the patient has consented to  Procedure(s): LEFT HEART CATHETERIZATION WITH CORONARY ANGIOGRAM (N/A) as a surgical intervention .  The patient's history has been reviewed, patient examined, no change in status, stable for surgery.  I have reviewed the patient's chart and labs.  Questions were answered to the patient's satisfaction.     Doryan Bahl A

## 2013-08-31 NOTE — Progress Notes (Signed)
[name]  Patient stated this started right as I walked in the room.  Recheck BP was 112/72.  Dr. Sunnie Nielsen on floor and back down to see the patient.  Rapid Response Nurse Angelique Blonder called to assist.  Symptoms resolved within 10 minutes.  Orders received. Will continue to monitor.  Sean Rogers

## 2013-08-31 NOTE — H&P (View-Only) (Signed)
  CARDIOLOGY CONSULTATION NOTE.  NAME:  Sean Rogers   MRN: [id number] DOB:  12/09/1980   ADMIT DATE: 08/29/2013  Reason for Consult: New diagnosis of cardiomyopathy - EF 35% by stress echo  Requesting Physician: Dr. Belkys Regalodo  Primary Cardiologist: None  HPI: This is a 32 y.o. male with a past medical history significant for type 2 diabetes on insulin, hypertension, dyslipidemia, and mild obesity no significant family history of CAD, who was admitted by [facility] on February 3 after several days of exertional dyspnea. Indicate a story of being fine in his usual state of health have recovered from a viral infection that felt like flulike symptoms several weeks ago. Fully recovered and was with his wife at church on Friday evening, and after a very energetic portion of service, was quite fatigued. This level of exertion and fatigue he has not been on not usual as his wife is also fatigued, however in the morning he was extremely fatigued on this Saturday morning without any desire to do any activities. He also noted profoundly worsening exertional dyspnea. 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 The episode lasted much 30 minutes at a time and then go away. In the interim he also noted several episodes where he felt fluttering and rapid heart beats and it seemed erratic in nature. He felt somewhat lightheaded but no basilar Near syncope. He denies any TIA or amaurosis fugax symptoms. No PND orthopnea or edema, just the exertional dyspnea. He presented emergency room when he became so that we could barely stand up for more than 2 minutes.  Based on his risk of cardiac risk factor and the relatively classic  symptoms, he was scheduled for Treadmill Echocardiogram Stress Test. He stated he did relatively well wall walking on the treadmill until he got into stage III and he became profoundly dyspneic and was not able to continue. He does not have any of the chest pressure.  There were no electrocardiographic or echocardiographic findings of potential ischemia, however his ejection fraction was noted to be 35% at rest, but improved with exertion.  Based on his abnormal findings, we were consulted for assistance.  Past Medical History  Diagnosis Date  . Diabetes mellitus   . Obesity   . Hyperlipidemia   . Hypertension    Past Surgical History  Procedure Laterality Date  . Hip pinning      high school football injury (left hip)   FAMHx: Family History  Problem Relation Age of Onset  . Diabetes Mother   . Hypertension Mother   . Hyperlipidemia Mother   . Diabetes Sister   . Diabetes Maternal Uncle   . Cancer Maternal Grandmother     SOCHx:  reports that he has never smoked. He does not have any smokeless tobacco history on file. He reports that he does not drink alcohol or use illicit drugs.  ALLERGIES: No Known Allergies  ROS: Cardiac ROS positive for: chest pressure/discomfort, dyspnea, exertional chest pressure/discomfort, fatigue, irregular heart beat and palpitations A comprehensive review of systems was negative except for: Constitutional: positive for fatigue Recent viral illness that both he and his wife had with coughing congestion fatigue mild fevers and chills.  HOME MEDICATIONS: Prescriptions   prior to admission  Medication Sig Dispense Refill  . insulin glargine (LANTUS) 100 UNIT/ML injection Inject 20 Units into the skin at bedtime.      . lisinopril-hydrochlorothiazide (PRINZIDE,ZESTORETIC) 20-12.5 MG per tablet Take 1 tablet by mouth daily.      . metFORMIN (GLUCOPHAGE) 1000 MG tablet Take 1,000 mg by mouth 2 (two) times daily with a meal.      . simvastatin (ZOCOR) 40  MG tablet Take 40 mg by mouth daily.        HOSPITAL MEDICATIONS: . aspirin EC  325 mg Oral Daily  . heparin  5,000 Units Subcutaneous Q8H  . lisinopril  20 mg Oral Daily   And  . hydrochlorothiazide  12.5 mg Oral Daily  . insulin aspart  0-15 Units Subcutaneous TID WC  . insulin glargine  20 Units Subcutaneous QHS  . metoprolol tartrate  25 mg Oral BID  . simvastatin  40 mg Oral Daily  . sodium chloride  3 mL Intravenous Q12H   I have reviewed the patient's current medications.  VITALS: Blood pressure 129/80, pulse 82, temperature 97.9 F (36.6 C), temperature source Oral, resp. rate 20, height 5' 10" (1.778 m), SpO2 98.00%.  PHYSICAL EXAM: General appearance: alert, cooperative, appears stated age, no distress, moderately obese and well-nourished and well-groomed. Neck: no adenopathy, no carotid bruit, supple, symmetrical, trachea midline and unable to really assess JVP due to body habitus. HEENT: [place]/AT, EOMI, MMM, anicteric sclera Lungs: clear to auscultation bilaterally and normal percussion bilaterally Heart: normal apical impulse, regular rate and rhythm, S1, S2 normal and cannot exclude the presence of a soft S4 gallop but otherwise no M./R./G. Abdomen: soft, non-tender; bowel sounds normal; no masses,  no organomegaly Extremities: extremities normal, atraumatic, no cyanosis or edema, no edema, redness or tenderness in the calves or thighs and no ulcers, gangrene or trophic changes Pulses: 2+ and symmetric Skin: Skin color, texture, turgor normal. No rashes or lesions Neurologic: Alert and oriented X 3, normal strength and tone. Normal symmetric reflexes. Normal coordination and gait  LABS: Results for orders placed during the hospital encounter of 08/29/13 (from the past 24 hour(s))  GLUCOSE, CAPILLARY     Status: Abnormal   Collection Time    08/29/13  8:41 PM      Result Value Range   Glucose-Capillary 180 (*) 70 - 99 mg/dL  CBC     Status: Abnormal   Collection  Time    08/29/13  9:57 PM      Result Value Range   WBC 8.1  4.0 - 10.5 K/uL   RBC 4.37  4.22 - 5.81 MIL/uL   Hemoglobin 13.5  13.0 - 17.0 g/dL   HCT 38.2 (*) 39.0 - 52.0 %   MCV 87.4  78.0 - 100.0 fL   MCH 30.9  26.0 - 34.0 pg   MCHC 35.3  30.0 - 36.0 g/dL   RDW 12.5  11.5 - 15.5 %   Platelets 213  150 - 400 K/uL  CREATININE, SERUM     Status: None   Collection Time    08/29/13  9:57 PM      Result Value Range   Creatinine, Ser 1.04  0.50 - 1.35 mg/dL   GFR calc non Af Amer >90  >90 mL/min   GFR calc Af Amer >90  >90 mL/min  HEMOGLOBIN A1C     Status: Abnormal   Collection Time    08/29/13  9:57 PM      Result Value   Range   Hemoglobin A1C 7.3 (*) <5.7 %   Mean Plasma Glucose 163 (*) <117 mg/dL  TROPONIN I     Status: None   Collection Time    08/29/13  9:57 PM      Result Value Range   Troponin I <0.30  <0.30 ng/mL  TSH     Status: None   Collection Time    08/29/13  9:57 PM      Result Value Range   TSH 2.582  0.350 - 4.500 uIU/mL  MAGNESIUM     Status: None   Collection Time    08/29/13  9:57 PM      Result Value Range   Magnesium 2.2  1.5 - 2.5 mg/dL  PHOSPHORUS     Status: None   Collection Time    08/29/13  9:57 PM      Result Value Range   Phosphorus 3.7  2.3 - 4.6 mg/dL  TROPONIN I     Status: None   Collection Time    08/30/13  3:35 AM      Result Value Range   Troponin I <0.30  <0.30 ng/mL  TROPONIN I     Status: None   Collection Time    08/30/13  9:50 AM      Result Value Range   Troponin I <0.30  <0.30 ng/mL   IMAGING: Chest x-ray February 3, no acute disease.  IMPRESSION: Active Problems:   Chest tightness or pressure -with exertion   Chest pain with moderate risk for cardiac etiology   Dyspnea on exertion   Cardiomyopathy - unclear etiology   Type 2 diabetes mellitus   Obesity   Hypertension   Hyperlipidemia   Diabetes mellitus   RECOMMENDATION: Based on these symptoms of chest discomfort, with new diagnosis of reduced ejection  fraction, despite not having evidence of ischemia on the echocardiogram evaluation, we cannot exclude multivessel CAD. He has significant cardiac risk factors despite his age. At this point, my recommendation made to proceed with diagnostic cardiac catheterization to confirm the absence of CAD suggested by the lack of wall motion abnormalities changes on exercise echocardiogram.  Plan adjusted based on the results of the catheterization. My running diagnoses for now is that his cardiomyopathy is related to his recent illness this is a viral illness. However, it still be important to exclude coronary disease based on him having chest pressure with exertion. I spent at least 15 minutes to the patient about the procedure itself along with risks, benefits alternatives and indications. He and his wife voice understanding and agree to proceed.  He is on an ACE inhibitor which is appropriate, is also beta blocker, however I would prefer to convert to carvedilol for better treatment. He is not having active heart failure symptoms, therefore would not recommend diuretic beyond HCTZ unless his left ventricular filling pressures were elevated on cardiac catheterization.  I will make n.p.o. after midnight and have written precath orders.  Thank you each in consult, Thank you for allowing us to participate in the care of since she gentleman. I would happy to see him in followup post discharge. Further plans pending results of cardiac catheterization.   Time Spent Directly with Patient: 45 minutes  HARDING,DAVID W, M.D., M.S. [facility] [address] [place], [place]  [address]  [phone]  08/30/2013 10:14 PM     

## 2013-08-31 NOTE — Progress Notes (Signed)
  Echocardiogram Echocardiogram Stress Test has been performed.  Briann Sarchet, [facility] 08/31/2013, 8:54 AM

## 2013-08-31 NOTE — Consult Note (Signed)
Neurology Consultation Reason for Consult: Transient visual symptoms Referring Physician: Regalado, B  CC: Transient visual symptoms  History is obtained from:Patient, family member  HPI: Sean Rogers is a 33 y.o. male with a history of dm, hpl, htn admitted with low EF of 35% thguoht to be possibly a viral syndrome. He was getting ready for discharge earlier when he had an unusual sensation of seeing something "like a fan" with flickering lights to the left as well as feeling like the left side of his face was swelling. This lasted for approximately 10 minutes and then resolved. Some time later, ? Almost an hour, he noticed that he had a headache which lasted until he went to sleep. Marland Kitchen   He does have a history of migraines, getting them as frequently as once per week at one time, though not nearly that frequent any longer.    ROS: A 14 point ROS was performed and is negative except as noted in the HPI.  Past Medical History  Diagnosis Date  . Diabetes mellitus   . Obesity   . Hyperlipidemia   . Hypertension     Family History: Grandfather - cva  Social History: Tob: denies  Exam: Current vital signs: BP 114/54  Pulse 75  Temp(Src) 98.1 F (36.7 C) (Oral)  Resp 16  Ht 5\' 10"  (1.778 m)  Wt 119.568 kg (263 lb 9.6 oz)  BMI 37.82 kg/m2  SpO2 98% Vital signs in last 24 hours: Temp:  [97.5 F (36.4 C)-98.1 F (36.7 C)] 98.1 F (36.7 C) (02/05 1820) Pulse Rate:  [67-90] 75 (02/05 1945) Resp:  [16-18] 16 (02/05 1820) BP: (88-127)/(34-72) 114/54 mmHg (02/05 1945) SpO2:  [97 %-100 %] 98 % (02/05 1945) Weight:  [119.568 kg (263 lb 9.6 oz)] 119.568 kg (263 lb 9.6 oz) (02/05 0758)  General: in bed, NAD CV: RRR Mental Status: Patient is awake, alert, oriented to person, place, month, year, and situation. Immediate and remote memory are intact. Patient is able to give a clear and coherent history. No signs of aphasia or neglect Cranial Nerves: II: Visual Fields are full.  Pupils are equal, round, and reactive to light.  Discs are difficult to visualize. III,IV, VI: EOMI without ptosis or diploplia.  V: Facial sensation is symmetric to temperature VII: Facial movement is symmetric.  VIII: hearing is intact to voice X: Uvula elevates symmetrically XI: Shoulder shrug is symmetric. XII: tongue is midline without atrophy or fasciculations.  Motor: Tone is normal. Bulk is normal. 5/5 strength was present in all four extremities.  Sensory: Sensation is symmetric to light touch and temperature in the arms and legs. Deep Tendon Reflexes: 2+ and symmetric in the biceps and patellae.  Plantars: Toes are downgoing bilaterally.  Cerebellar: FNF and HKS are intact bilaterally Gait: Patient has a stable casual gait, though very limited testing due to multiple monitors.     I have reviewed labs in epic and the results pertinent to this consultation are: BMP - mildly high glucose  I have reviewed the images obtained:CT head - negative.   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 He is at risk for TIA, though 35% is usually not low enough to merit anticoagulation, and he will need to continue to work on DM, hpl, and htn, but I do not have a high  suspicion that the episode today was TIA.   I would favor continued ASA therapy given that this is a relatively low risk intervention and he has multiple other stroke risk factors including his low EF.   Recommendations: 1) Continue ASA if no contraindications 2) No further workup needed at this time, neurology will sign off. Please call if there are any further questions.    Ritta SlotMcNeill Kirkpatrick, MD [facility] [phone]  If 7pm- 7am, please page neurology on call at [phone].

## 2013-08-31 NOTE — Code Documentation (Signed)
Called by primary RN for patient experiencing some numbness in face.  Upon my arrival to patients room, family, Rn and Dr. Sunnie Nielsen at bedside.  Patients symptoms have completely resolved.  Md states will contact neurology.  Family concerned about finances related to hospital bill.  No interventions at this time, Rn to call if assistance needed

## 2013-09-01 DIAGNOSIS — R51 Headache: Secondary | ICD-10-CM

## 2013-09-01 LAB — GLUCOSE, CAPILLARY: Glucose-Capillary: 125 mg/dL — ABNORMAL HIGH (ref 70–99)

## 2013-09-01 MED ORDER — LISINOPRIL 20 MG PO TABS: 20.0000 mg | ORAL_TABLET | Freq: Every day | ORAL | Status: DC

## 2013-09-01 NOTE — ED Provider Notes (Signed)
Medical screening examination/treatment/procedure(s) were performed by non-physician practitioner and as supervising physician I was immediately available for consultation/collaboration.    Date: 09/01/2013  Rate: 102  Rhythm: sinus tachycardia  QRS Axis: normal  Intervals: normal  ST/T Wave abnormalities: normal  Conduction Disutrbances:multiform premature atrial complexes  Narrative Interpretation:   Old EKG Reviewed: changes noted, new premature complexes        Shanna Cisco, MD 09/01/13 1605

## 2013-09-01 NOTE — Discharge Summary (Addendum)
Physician Discharge Summary  Sean Rogers ZOX:[id number] DOB: Dec 16, 1980 DOA: 08/29/2013  PCP: Elvina Sidle, MD  Admit date: 08/29/2013 Discharge date: 09/01/2013  Time spent: 35 minutes  Recommendations for Outpatient Follow-up:  1. Follow up with Dr Herbie Baltimore for further titration of medication for cardiomyopathy.  2. Need follow up with PCP , consider resume HCTZ if BP allows it. Consider order HIV.   Discharge Diagnoses:    Non Ischemic Cardiomyopathy - unclear etiology, probably secondary to viral infection.    Migraine.    Type 2 diabetes mellitus   Obesity   Hypertension   Hyperlipidemia   Chest tightness or pressure -with exertion   Diabetes mellitus  Discharge Condition: Stable.  Diet recommendation: Heart healthy.   Filed Weights   08/31/13 0758  Weight: 119.568 kg (263 lb 9.6 oz)    History of present illness:  This is a 33 y.o. male with a past medical history significant for type 2 diabetes on insulin, hypertension, dyslipidemia, and mild obesity no significant family history of CAD, who was admitted by [facility] on February 3 after several days of exertional dyspnea.  Indicate a story of being fine in his usual state of health have recovered from a viral infection that felt like flulike symptoms several weeks ago. Fully recovered and was with his wife at church on Friday evening, and after a very energetic portion of service, was quite fatigued. This level of exertion and fatigue he has not been on not usual as his wife is also fatigued, however in the morning he was extremely fatigued on this Saturday morning without any desire to do any activities. He also noted profoundly worsening exertional dyspnea. 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 The episode lasted much 30 minutes at a time and then go away. In the interim he also noted several episodes where he felt fluttering and rapid heart beats and it seemed erratic in nature. He felt somewhat lightheaded but no basilar Near syncope. He denies any TIA or amaurosis fugax symptoms. No PND orthopnea or edema, just the exertional dyspnea.  He presented emergency room when he became so that we could barely stand up for more than 2 minutes.   Hospital Course:  1-Cardiomyopathy Ef 35 % new diagnosis. Cardio consulted. Had viral infection 1 week ago. On ACE, coreg. Cath with normal CA. SBP soft in the 110. Will continue with coreg and lisinopril at discharge.  will hold HCTZ. No further episode of chest pain.   2-Chest pain; in setting of cardiomyopathy. He underwent Stress ECHO no evidence of ischemia. New diagnosis cardiomyopathy. Ef 35 %. Cath with normal CA.  3-Migraine; patient had an episode of transient left eye unusual sensation, left side numbness. After that he develop headache. CT head negative. Neurology consulted. Neuro think patient had migraine.  3. Diabetes; on Lantus and SSI. Advised to resume metformin 2-8 due to recent contrast.    Procedures: Stress ECHO; Stress echocardiogram with no chest pain; hypertensive response; no ST changes; global hypokinesis at baseline with EF 35; improvement in LV function with stress (EF 60) with no stress induced wall motion abnormalities. Findings consistent with probable nonischemic cardiomyopathy. Bruce protocol. Stress echocardiography. Patient status: Inpatient.  Cardiac Cath; normal  CA.   Consultations:  Cardiology  Neurology.   Discharge Exam: Filed Vitals:   09/01/13 0730  BP: 115/74  Pulse: 70  Temp: 98 F (36.7 C)  Resp: 16    General: No distress.  Cardiovascular: S 1, S 2 RRR Respiratory: CTA Neuro exam ; non focal.   Discharge  Instructions      Discharge Orders   Future Orders Complete By Expires   Diet - low sodium heart healthy  As directed    Diet - low sodium heart healthy  As directed    Increase activity slowly  As directed    Increase activity slowly  As directed        Medication List    STOP taking these medications       lisinopril-hydrochlorothiazide 20-12.5 MG per tablet  Commonly known as:  PRINZIDE,ZESTORETIC      TAKE these medications       aspirin 81 MG EC tablet  Take 1 tablet (81 mg total) by mouth daily.     carvedilol 3.125 MG tablet  Commonly known as:  COREG  Take 1 tablet (3.125 mg total) by mouth 2 (two) times daily with a meal.     insulin glargine 100 UNIT/ML injection  Commonly known as:  LANTUS  Inject 20 Units into the skin at bedtime.     lisinopril 20 MG tablet  Commonly known as:  PRINIVIL,ZESTRIL  Take 1 tablet (20 mg total) by mouth daily.     metFORMIN 1000 MG tablet  Commonly known as:  GLUCOPHAGE  Take 1 tablet (1,000 mg total) by mouth 2 (two) times daily with a meal.     simvastatin 40 MG tablet  Commonly known as:  ZOCOR  Take 40 mg by mouth daily.       No Known Allergies Follow-up Information   Follow up with HARDING,DAVID W, MD. (our office will call with date and time)    Specialty:  Cardiology   Contact information:   [address] [place] [address] [phone]        The results of significant diagnostics from this hospitalization (including imaging, microbiology, ancillary and laboratory) are listed below for reference.    Significant Diagnostic Studies: Dg Chest 2 View  08/29/2013   CLINICAL DATA:  Chest pain.  EXAM: CHEST  2 VIEW  COMPARISON:  05/02/2012.  FINDINGS: Poor inspiration. Lungs are clear. No pleural effusion or pneumothorax. Stable heart size, no pulmonary venous congestion. No acute bony abnormality .  IMPRESSION: No active cardiopulmonary disease.   Electronically Signed   By: Maisie Fus  Register    On: 08/29/2013 17:20   Ct Head Wo Contrast  08/31/2013   CLINICAL DATA:  33 year old male with left facial numbness, left eye visual disturbance. Initial encounter.  EXAM: CT HEAD WITHOUT CONTRAST  TECHNIQUE: Contiguous axial images were obtained from the base of the skull through the vertex without intravenous contrast.  COMPARISON:  None.  FINDINGS: Small right maxillary sinus mucous retention cyst. Other Visualized paranasal sinuses and mastoids are clear. No acute osseous abnormality identified. Visualized scalp soft tissues are within normal limits. Visualized orbit soft tissues are within normal limits.  Cerebral volume is normal. No suspicious intracranial vascular hyperdensity. No midline shift, ventriculomegaly, mass effect, evidence of mass lesion, intracranial hemorrhage or evidence of cortically based acute infarction. Gray-white matter differentiation is within normal limits throughout the brain. Suggestion of partially empty sella.  IMPRESSION: Normal noncontrast CT appearance of the brain.  Electronically Signed   By: Augusto GambleLee  Hall M.D.   On: 08/31/2013 18:21    Microbiology: No results found for this or any previous visit (from the past 240 hour(s)).   Labs: Basic Metabolic Panel:  Recent Labs Lab 08/29/13 1640 08/29/13 2157 08/31/13 0540  NA 139  --  144  K 4.2  --  4.1  CL 102  --  102  CO2 26  --  29  GLUCOSE 209*  --  109*  BUN 13  --  16  CREATININE 0.99 1.04 1.16  CALCIUM 8.9  --  9.0  MG  --  2.2  --   PHOS  --  3.7  --    Liver Function Tests: No results found for this basename: AST, ALT, ALKPHOS, BILITOT, PROT, ALBUMIN,  in the last 168 hours No results found for this basename: LIPASE, AMYLASE,  in the last 168 hours No results found for this basename: AMMONIA,  in the last 168 hours CBC:  Recent Labs Lab 08/29/13 1640 08/29/13 2157  WBC 7.6 8.1  NEUTROABS 4.2  --   HGB 13.4 13.5  HCT 38.3* 38.2*  MCV 87.2 87.4  PLT 223 213   Cardiac  Enzymes:  Recent Labs Lab 08/29/13 2157 08/30/13 0335 08/30/13 0950  TROPONINI <0.30 <0.30 <0.30   BNP: BNP (last 3 results)  Recent Labs  08/29/13 1640  PROBNP 145.9*   CBG:  Recent Labs Lab 08/31/13 0727 08/31/13 1138 08/31/13 1624 08/31/13 2205 09/01/13 0729  GLUCAP 134* 147* 103* 187* 125*       Signed:  REGALADO,BELKYS  [facility] 09/01/2013, 11:17 AM

## 2013-09-10 ENCOUNTER — Other Ambulatory Visit: Payer: Self-pay | Admitting: Physician Assistant

## 2013-09-12 ENCOUNTER — Ambulatory Visit: Payer: BC Managed Care – PPO | Admitting: Cardiology

## 2013-09-15 ENCOUNTER — Ambulatory Visit (INDEPENDENT_AMBULATORY_CARE_PROVIDER_SITE_OTHER): Payer: BC Managed Care – PPO | Admitting: Physician Assistant

## 2013-09-15 ENCOUNTER — Encounter: Payer: Self-pay | Admitting: Physician Assistant

## 2013-09-15 VITALS — BP 128/80 | HR 75 | Ht 70.0 in | Wt 285.0 lb

## 2013-09-15 DIAGNOSIS — E669 Obesity, unspecified: Secondary | ICD-10-CM

## 2013-09-15 DIAGNOSIS — I429 Cardiomyopathy, unspecified: Secondary | ICD-10-CM

## 2013-09-15 DIAGNOSIS — E119 Type 2 diabetes mellitus without complications: Secondary | ICD-10-CM

## 2013-09-15 DIAGNOSIS — R079 Chest pain, unspecified: Secondary | ICD-10-CM

## 2013-09-15 DIAGNOSIS — I428 Other cardiomyopathies: Secondary | ICD-10-CM

## 2013-09-15 DIAGNOSIS — E785 Hyperlipidemia, unspecified: Secondary | ICD-10-CM

## 2013-09-15 DIAGNOSIS — I10 Essential (primary) hypertension: Secondary | ICD-10-CM

## 2013-09-15 NOTE — Patient Instructions (Signed)
1.  keep sodium intake to 2000 mg or less daily.  Weight yourself every morning.  If your eight increases 2 pounds in 24 hours, or 5 pounds in a week, please call our office. 2.  schedule a 2-D echocardiogram in 3 months and then followup with Dr. Herbie Baltimore. 3.  I will also send in referral for a registered dietitian.

## 2013-09-15 NOTE — Assessment & Plan Note (Signed)
Patient appears well compensated no signs of acute heart failure exacerbation. We discussed daily weights and continue low-sodium diet.  He will call the office if he gains 2 pounds in one day or 5 pounds 7 days.  He's currently on lisinopril 20 mg Coreg 3.125.  We'll repeat 2-D echocardiogram in 3 months and he'll followup with Dr. Herbie Baltimore.

## 2013-09-15 NOTE — Assessment & Plan Note (Signed)
Treated with Zocor 40 mg

## 2013-09-15 NOTE — Assessment & Plan Note (Signed)
We discussed referral to a registered dietitian and the patient would like to pursue this.

## 2013-09-15 NOTE — Assessment & Plan Note (Signed)
Blood pressure is well controlled on lisinopril and carvedilol. 

## 2013-09-15 NOTE — Assessment & Plan Note (Signed)
1 metformin and Lantus

## 2013-09-15 NOTE — Assessment & Plan Note (Signed)
Normal coronary arteries by recent cardiac catheterization.

## 2013-09-15 NOTE — Progress Notes (Signed)
Date:  09/15/2013   ID:  Sean Rogers, DOB 08/18/1980, MRN [id number]  PCP:  Carola FrostUNN,RYAN, PA-C  Primary Cardiologist:  Harding(new)     History of Present Illness: Sean KrabbeDonta L Rogers is a 33 y.o. obese male with a past medical history significant for type 2 diabetes on insulin, hypertension, dyslipidemia. No significant family history of CAD, who was admitted by [facility] on February 3 after several days of exertional dyspnea.   Prior to being admitted he was recovering from a viral infection that felt like flulike symptoms several weeks ago. He was with his wife at church on Friday evening, and after a very energetic portion of service, was quite fatigued. This level of exertion and fatigue he has not been on not usual as his wife is also fatigued, however in the morning he was extremely fatigued on this Saturday morning without any desire to do any activities. He also noted profoundly worsening exertional dyspnea. 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 The episode lasted much 30 minutes at a time and then go away. In the interim he also noted several episodes where he felt fluttering and rapid heart beats and it seemed erratic in nature. He felt somewhat lightheaded but no basilar Near syncope. He denies any TIA or amaurosis fugax symptoms. No PND orthopnea or edema, just the exertional dyspnea.  He presented emergency room when he became so tired that we could barely stand up for more than 2 minutes.   He was scheduled for Treadmill Echocardiogram Stress Test. He stated he did relatively well wall walking on the treadmill until he got into stage III and he became profoundly dyspneic and was not able to continue.  He does not have any of the chest pressure. There were no electrocardiographic or echocardiographic findings of potential ischemia, however his ejection fraction was noted to be 35% at rest, but improved with exertion.  Left heart catheterization which revealed normal coronary arteries at ejection fraction of possibly 35%.  Patient presents today for a post hospital followup. He reports having 1 episode of chest pain which resolves spontaneously and he had no further episodes.  The patient currently denies nausea, vomiting, fever, shortness of breath, orthopnea, dizziness, PND, cough, congestion, abdominal pain, hematochezia, melena, lower extremity edema, claudication.  Wt Readings from Last 3 Encounters:  09/15/13 285 lb (129.275 kg)  08/31/13 263 lb 9.6 oz (119.568 kg)  08/31/13 263 lb 9.6 oz (119.568 kg)     Past Medical History  Diagnosis Date  . Diabetes mellitus   . Obesity   . Hyperlipidemia   . Hypertension     Current Outpatient Prescriptions  Medication Sig Dispense Refill  . aspirin EC 81 MG EC tablet Take 1 tablet (81 mg total) by mouth daily.  30 tablet  0  . carvedilol (COREG) 3.125 MG tablet Take 1 tablet (3.125 mg total) by mouth 2 (two) times daily with a meal.  60 tablet  0  . insulin glargine (LANTUS) 100 UNIT/ML injection Inject 20 Units into the skin at bedtime.      Marland Kitchen. lisinopril (PRINIVIL,ZESTRIL) 20 MG tablet Take 1 tablet (20 mg total) by mouth daily.  30 tablet  0  . metFORMIN (GLUCOPHAGE)  1000 MG tablet Take 1 tablet (1,000 mg total) by mouth 2 (two) times daily with a meal.  10 tablet  0  . Multiple Vitamin (MULTIVITAMIN) tablet Take 1 tablet by mouth daily.      . simvastatin (ZOCOR) 40 MG tablet Take 40 mg by mouth daily.       No current facility-administered medications for this visit.    Allergies:   No Known Allergies  Social History:  The patient  reports that he has never smoked. He does not have any smokeless tobacco history on file. He reports  that he does not drink alcohol or use illicit drugs.   Family history:   Family History  Problem Relation Age of Onset  . Diabetes Mother   . Hypertension Mother   . Hyperlipidemia Mother   . Diabetes Sister   . Diabetes Maternal Uncle   . Cancer Maternal Grandmother     ROS:  Please see the history of present illness.  All other systems reviewed and negative.   PHYSICAL EXAM: VS:  BP 128/80  Pulse 75  Ht 5\' 10"  (1.778 m)  Wt 285 lb (129.275 kg)  BMI 40.89 kg/m2 OB, well developed, in no acute distress HEENT: Pupils are equal round react to light accommodation extraocular movements are intact.  Neck: no JVDNo cervical lymphadenopathy. Cardiac: Regular rate and rhythm without murmurs rubs or gallops. Lungs:  clear to auscultation bilaterally, no wheezing, rhonchi or rales Abd: soft, nontender, positive bowel sounds all quadrants, no hepatosplenomegaly Ext: no lower extremity edema.  2+ radial and dorsalis pedis pulses. Skin: warm and dry Neuro:  Grossly normal  EKG:  NSR 75 BPM.      ASSESSMENT AND PLAN:  Problem List Items Addressed This Visit   Type 2 diabetes mellitus (Chronic)     1 metformin and Lantus    Obesity (Chronic)     We discussed referral to a registered dietitian and the patient would like to pursue this.    Hypertension (Chronic)     Blood pressure is well-controlled on lisinopril and carvedilol.    Hyperlipidemia (Chronic)     Treated with Zocor 40 mg    Chest pain - Primary     Normal coronary arteries by recent cardiac catheterization.      Relevant Orders      EKG 12-Lead   Cardiomyopathy - unclear etiology     Patient appears well compensated no signs of acute heart failure exacerbation. We discussed daily weights and continue low-sodium diet.  He will call the office if he gains 2 pounds in one day or 5 pounds 7 days.  He's currently on lisinopril 20 mg Coreg 3.125.  We'll repeat 2-D echocardiogram in 3 months and he'll followup with Dr.  Herbie Baltimore.     Other Visit Diagnoses   Non-ischemic cardiomyopathy        Relevant Orders       2D Echocardiogram without contrast

## 2013-10-09 ENCOUNTER — Encounter: Payer: Self-pay | Admitting: Physician Assistant

## 2013-10-09 ENCOUNTER — Ambulatory Visit (INDEPENDENT_AMBULATORY_CARE_PROVIDER_SITE_OTHER): Payer: BC Managed Care – PPO | Admitting: Physician Assistant

## 2013-10-09 VITALS — BP 120/62 | HR 87 | Temp 98.5°F | Resp 16 | Ht 69.0 in | Wt 278.4 lb

## 2013-10-09 DIAGNOSIS — E785 Hyperlipidemia, unspecified: Secondary | ICD-10-CM

## 2013-10-09 DIAGNOSIS — I428 Other cardiomyopathies: Secondary | ICD-10-CM

## 2013-10-09 DIAGNOSIS — E669 Obesity, unspecified: Secondary | ICD-10-CM

## 2013-10-09 DIAGNOSIS — I10 Essential (primary) hypertension: Secondary | ICD-10-CM

## 2013-10-09 DIAGNOSIS — E119 Type 2 diabetes mellitus without complications: Secondary | ICD-10-CM

## 2013-10-09 DIAGNOSIS — E109 Type 1 diabetes mellitus without complications: Secondary | ICD-10-CM

## 2013-10-09 DIAGNOSIS — Z794 Long term (current) use of insulin: Principal | ICD-10-CM

## 2013-10-09 DIAGNOSIS — IMO0001 Reserved for inherently not codable concepts without codable children: Secondary | ICD-10-CM

## 2013-10-09 LAB — GLUCOSE, POCT (MANUAL RESULT ENTRY): POC Glucose: 91 mg/dl (ref 70–99)

## 2013-10-09 LAB — POCT GLYCOSYLATED HEMOGLOBIN (HGB A1C): Hemoglobin A1C: 6.5

## 2013-10-09 MED ORDER — METFORMIN HCL 1000 MG PO TABS: 1000.0000 mg | ORAL_TABLET | Freq: Two times a day (BID) | ORAL | Status: DC

## 2013-10-09 MED ORDER — SIMVASTATIN 40 MG PO TABS: 40.0000 mg | ORAL_TABLET | Freq: Every day | ORAL | Status: DC

## 2013-10-09 MED ORDER — CARVEDILOL 3.125 MG PO TABS: 3.1250 mg | ORAL_TABLET | Freq: Two times a day (BID) | ORAL | Status: DC

## 2013-10-09 MED ORDER — INSULIN GLARGINE 100 UNIT/ML ~~LOC~~ SOLN: 20.0000 [IU] | Freq: Every day | SUBCUTANEOUS | Status: DC

## 2013-10-09 MED ORDER — LISINOPRIL 20 MG PO TABS: 20.0000 mg | ORAL_TABLET | Freq: Every day | ORAL | Status: DC

## 2013-10-09 NOTE — Progress Notes (Signed)
Subjective:    Patient ID: Sean Rogers, male    DOB: [birth year], 33 y.o.   MRN: [id number]  HPI Primary Physician: Carola Frost  Chief Complaint: Follow up  HPI: 33 y.o. male with history of IDDM, non-ischemic cardiomyopathy, obesity, hypertension, hyperlipidemia, and chest pain presents for follow up. I last saw the patient on 12/26/12 for his DM follow up. Since that time he has undergone an extensive cardiac evaluation for chest pain.  On 08/29/13 the patient was admitted to the hospital by [facility] after several days of exertional dyspnea. Prior to the onset of this he noted that he seemed to be recovering from a viral infection and felt flu-like a few weeks before. He was at church and noted he did not have his usual energy. This persisted into the following day. He was noticing worsening dyspnea. 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 This last 30 minutes, then resolved. He also noted some heart fluttering and rapid beats. He felt somewhat lightheaded. No syncope. He presented to the ED when he became so tired that he could not stand for extended time periods.   During his work up in the hospital and had a Treadmill Echocardiogram Stress Test. Prior notes indicate he did well until he got to stage III and became profoundly dyspneic and was not able to continue. He did not have any of the pressure per previous note. There were no EKG or echo findings of potential ischemia, however his EF was noted to be 35% at rest. This improved with exertion per note. Left heart cath revealed normal coronary arteries at EF of possibly 35%.             At his cardiology outpatient follow up on 09/15/13 he reported having had 1 episode of CP that resolved spontaneously and had no further episodes.   Today he comes in stating that he does feel better. He is now taking all of his  medications. Prior to his hospital admission he had stopped taking his lisinopril and simvastatin. He is taking is easier at work. He does still have some episodes of dyspnea, but they do not last anywhere near as long as before. These episodes are not exertional. He has had some chest tightness, again not exertional. He denies any chest pain. No diaphoresis, nausea, vomiting, dizziness, syncope, edema, or palpitations. He is checking his weight daily and has not noticed any significant change. He is watching his salt intake. His sugars at home have been running between 117 and 147.       Past Medical History  Diagnosis Date  . Diabetes mellitus   . Obesity   . Hyperlipidemia   . Hypertension      Home Meds: Prior to Admission medications   Medication Sig Start Date End Date Taking? Authorizing Provider  aspirin EC 81 MG EC tablet Take 1 tablet (81 mg total) by mouth daily. 09/01/13  Yes Belkys A Kary Kos, MD  carvedilol (COREG) 3.125 MG tablet Take 1 tablet (3.125 mg total) by mouth 2 (two) times daily with a meal. 08/31/13  Yes Belkys A Kary Kos, MD  insulin glargine (LANTUS) 100 UNIT/ML injection Inject 20 Units into the skin at bedtime.   Yes Historical Provider, MD  lisinopril (PRINIVIL,ZESTRIL) 20 MG tablet Take 1 tablet (20 mg total) by mouth daily. 09/01/13  Yes Belkys A Kary Kos,  MD  metFORMIN (GLUCOPHAGE) 1000 MG tablet Take 1 tablet (1,000 mg total) by mouth 2 (two) times daily with a meal. 08/31/13  Yes Belkys A Kary Kosegalado Romero, MD  Multiple Vitamin (MULTIVITAMIN) tablet Take 1 tablet by mouth daily.   Yes Historical Provider, MD  simvastatin (ZOCOR) 40 MG tablet Take 40 mg by mouth daily.   Yes Historical Provider, MD    Allergies: No Known Allergies  History   Social History  . Marital Status: Married    Spouse Name: N/A    Number of Children: N/A  . Years of Education: N/A   Occupational History  . Not on file.   Social History Main Topics  . Smoking  status: Never Smoker   . Smokeless tobacco: Not on file  . Alcohol Use: No  . Drug Use: No  . Sexual Activity: Yes   Other Topics Concern  . Not on file   Social History Narrative  . No narrative on file     Review of Systems  Constitutional: Negative for unexpected weight change.  Eyes: Negative for visual disturbance.  Respiratory:       Per HPI.   Cardiovascular:       Per HPI.   Endocrine: Negative for polydipsia, polyphagia and polyuria.  Neurological: Negative for syncope and headaches.  Psychiatric/Behavioral: Negative for suicidal ideas, behavioral problems, confusion, sleep disturbance, self-injury, dysphoric mood, decreased concentration and agitation. The patient is not nervous/anxious and is not hyperactive.        Objective:   Physical Exam  Physical Exam: Blood pressure 120/62, pulse 87, temperature 98.5 F (36.9 C), temperature source Oral, resp. rate 16, height 5\' 9"  (1.753 m), weight 278 lb 6.4 oz (126.281 kg), SpO2 97.00%., Body mass index is 41.09 kg/(m^2). General: Well developed, well nourished, in no acute distress. Head: Normocephalic, atraumatic, eyes without discharge, sclera non-icteric, nares are without discharge. Bilateral auditory canals clear, TM's are without perforation, pearly grey and translucent with reflective cone of light bilaterally. Oral cavity moist, posterior pharynx without exudate, erythema, peritonsillar abscess, or post nasal drip. Uvula midline.   Neck: Supple. No thyromegaly. Full ROM. No lymphadenopathy. No nuchal rigidity.  Lungs: Clear bilaterally to auscultation without wheezes, rales, or rhonchi. Breathing is unlabored. Heart: RRR with S1 S2. No murmurs, rubs, or gallops appreciated. Msk:  Strength and tone normal for age. Extremities/Skin: Warm and dry. No clubbing or cyanosis. No edema. No rashes or suspicious lesions. Neuro: Alert and oriented X 3. Moves all extremities spontaneously. Gait is normal. CNII-XII grossly in  tact. Psych:  Responds to questions appropriately with a normal affect.   Labs: Results for orders placed in visit on 10/09/13  GLUCOSE, POCT (MANUAL RESULT ENTRY)      Result Value Ref Range   POC Glucose 91  70 - 99 mg/dl  POCT GLYCOSYLATED HEMOGLOBIN (HGB A1C)      Result Value Ref Range   Hemoglobin A1C 6.5          Assessment & Plan:  33 year old male with IDDM, non-ischemic cardiomyopathy, chest pain, obesity, HTN, and hyperlipidemia.   1) IDDM -Lantus 20 units qhs #1 RF 2 -Metformin 1000 mg bid #60 RF 2 -ASA 81 mg daily -Continue healthy diet -Consider walking program, if tolerated  -Advised patient he needs to follow up q 3 months   2) Non-ischemic cardiomyopathy -Follow up with Dr. Herbie BaltimoreHarding as planned for repeated 2-D echo in May 2015 -Continue daily weights and low sodium diet -Call either [facility] or [facility] if gains 2 pounds in one day or 5 pounds in seven days -Continue lisinopril 20 mg daily and Coreg 3.125 mg bid  3) Chest pain -Improved -Normal coronary arteries by cath 08/2013  4) Obesity -Referral to dietitian  -Healthy diet and exercise -Weight loss  5) HTN -Continue lisinopril 20 mg daily -Continue Coreg 3.125 mg bid -Healthy diet and exercise -Weight loss -Low sodium  6) Hyperlipidemia -Zocor 40 mg qhs  -Helathy diet and exercise -Weight loss  7) Patient is to find something productive that he can do to take his mind off of his stressors and let me know what that is at his next follow up.    Eula Listen, MHS, PA-C [facility] [address] [place], [place] [address] [phone] [facility] 10/09/2013 6:19 PM

## 2013-10-10 ENCOUNTER — Other Ambulatory Visit: Payer: Self-pay | Admitting: Physician Assistant

## 2013-10-10 DIAGNOSIS — E109 Type 1 diabetes mellitus without complications: Secondary | ICD-10-CM

## 2013-10-10 DIAGNOSIS — Z794 Long term (current) use of insulin: Principal | ICD-10-CM

## 2013-10-10 DIAGNOSIS — IMO0001 Reserved for inherently not codable concepts without codable children: Secondary | ICD-10-CM

## 2013-10-10 DIAGNOSIS — E119 Type 2 diabetes mellitus without complications: Principal | ICD-10-CM

## 2013-10-14 MED ORDER — CARVEDILOL 3.125 MG PO TABS: 3.1250 mg | ORAL_TABLET | Freq: Two times a day (BID) | ORAL | Status: DC

## 2013-10-14 MED ORDER — INSULIN GLARGINE 100 UNIT/ML ~~LOC~~ SOLN: 20.0000 [IU] | Freq: Every day | SUBCUTANEOUS | Status: DC

## 2013-10-14 MED ORDER — METFORMIN HCL 1000 MG PO TABS: 1000.0000 mg | ORAL_TABLET | Freq: Two times a day (BID) | ORAL | Status: DC

## 2013-10-14 MED ORDER — SIMVASTATIN 40 MG PO TABS: 40.0000 mg | ORAL_TABLET | Freq: Every day | ORAL | Status: DC

## 2013-10-14 MED ORDER — LISINOPRIL 20 MG PO TABS: 20.0000 mg | ORAL_TABLET | Freq: Every day | ORAL | Status: DC

## 2013-10-14 NOTE — Addendum Note (Signed)
Addended by: Elease Etienne A on: 10/14/2013 03:30 PM   Modules accepted: Orders

## 2013-10-14 NOTE — Progress Notes (Signed)
[facility] called- did not receive prescriptions sent 3/16 at visit. Resent to the pharmacy.

## 2013-10-19 ENCOUNTER — Other Ambulatory Visit: Payer: Self-pay

## 2013-10-19 DIAGNOSIS — E119 Type 2 diabetes mellitus without complications: Secondary | ICD-10-CM

## 2013-10-19 MED ORDER — CARVEDILOL 3.125 MG PO TABS: 3.1250 mg | ORAL_TABLET | Freq: Two times a day (BID) | ORAL | Status: DC

## 2013-10-19 MED ORDER — LISINOPRIL 20 MG PO TABS: 20.0000 mg | ORAL_TABLET | Freq: Every day | ORAL | Status: DC

## 2013-10-23 ENCOUNTER — Ambulatory Visit (HOSPITAL_COMMUNITY)
Admission: RE | Admit: 2013-10-23 | Discharge: 2013-10-23 | Disposition: A | Discharge status: Home / Self Care | Payer: BC Managed Care – PPO | Source: Ambulatory Visit | Attending: Cardiovascular Disease | Admitting: Cardiovascular Disease

## 2013-10-23 DIAGNOSIS — I428 Other cardiomyopathies: Secondary | ICD-10-CM

## 2013-10-23 DIAGNOSIS — I517 Cardiomegaly: Secondary | ICD-10-CM

## 2013-10-23 HISTORY — PX: TRANSTHORACIC ECHOCARDIOGRAM: SHX275

## 2013-10-23 NOTE — Progress Notes (Signed)
  Echocardiogram 2D Echocardiogram has been performed.  Sean Rogers 10/23/2013, 3:40 PM

## 2013-10-30 ENCOUNTER — Ambulatory Visit (INDEPENDENT_AMBULATORY_CARE_PROVIDER_SITE_OTHER): Payer: BC Managed Care – PPO | Admitting: Cardiology

## 2013-10-30 ENCOUNTER — Encounter: Payer: Self-pay | Admitting: Cardiology

## 2013-10-30 VITALS — BP 122/76 | HR 84 | Ht 69.0 in | Wt 284.5 lb

## 2013-10-30 DIAGNOSIS — E785 Hyperlipidemia, unspecified: Secondary | ICD-10-CM

## 2013-10-30 DIAGNOSIS — I428 Other cardiomyopathies: Secondary | ICD-10-CM

## 2013-10-30 DIAGNOSIS — R0789 Other chest pain: Secondary | ICD-10-CM

## 2013-10-30 DIAGNOSIS — I10 Essential (primary) hypertension: Secondary | ICD-10-CM

## 2013-10-30 NOTE — Patient Instructions (Signed)
Dr Herbie Baltimore wants you to follow-up in 5 months. You will receive a reminder letter in the mail one months in advance. If you don't receive a letter, please call our office to schedule the follow-up appointment.

## 2013-10-30 NOTE — Progress Notes (Signed)
PATIENT: Sean Rogers MRN: [id number]  DOB: 09/29/1980   DOV:11/01/2013 PCP: Carola FrostUNN,RYAN, PA-C  Clinic Note: Chief Complaint  Patient presents with  . 2 months visit    result of echo,    faint chest discomfort, no sob , no edema,  will be seeing a dietician thru [place]   HPI: Sean Rogers is a 33 y.o.  male with a PMH below who presents today for shorter than expected followup for nonischemic cardiomyopathy.   He is a very pleasant gentleman with a history of type diabetes hypertension and hyperlipidemia as well as obesity who was admitted with chest tightness and pressure with dyspnea exacerbated by emotional stress after long church session. Apparently he had a viral flulike infection several weeks prior to this. On evaluation he ruled out for MI and was tested with a exercise echocardiogram that did not show any signs ischemia but did show moderately reduced ejection fraction. Because of the reduced ejection fraction and his presentation with chest pain, he was referred for cardiac catheterization which did not show any angiographic evidence of CAD. 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 He was placed on appropriate medications and discharged home in stable condition. He saw Wilburt FinlayBryan Hager, PA on February 20 and was doing relatively well. He had one episode of chest pain that resolved spontaneously but otherwise is doing well. The plan was for him to have an echocardiogram done at 3 months after the original inpatient echocardiogram, however this was done a month early just a week ago. As expected there is no improvement in the EF was 35-40%. Likely that would not meet criteria for a ICD or life vest.  Interva l History: Really since his followup appointment, his been doing fairly well. He denies any further episodes of dyspnea or PND/orthopnea. He has had occasional faint chest discomfort episodes. He has [facility], but has not yet been. Seems strange  because his weight when seen in clinic is roughly 20 pounds higher than 1 in the hospital. This is very unlikely.   He denies any PND, orthopnea or edema. No lightheadedness or dizziness or wooziness or syncope/near syncope no TIA or amaurosis fugax symptoms. He still has the intermittent episodes of discomfort in his chest but spontaneously resolves and not really associated with any particular activity. He denies any melena hematochezia or hematuria. No claudication.  Past Medical History  Diagnosis Date  . Diabetes mellitus   . Obesity   . Hyperlipidemia   . Hypertension   . Nonischemic cardiomyopathy February 2014    EF 35% by stress echo with no ischemic findings.-  Followup echo EF 35-40%    Prior Cardiac Evaluation and Past Surgical History: Past Surgical History  Procedure Laterality Date  . Hip pinning      high school football injury (left hip)  . Echocardiogram stress test  08/30/2013    Bruce protocol for 9 minutes/10 METS; no EKG changes. No echo evidence for ischemia.  Global hypokinesis with EF roughly 35% improved to 60% with stress. --> Findings consistent with nonischemic cardiomyopathy  . Transthoracic echocardiogram  10/23/2013    EF 35-40% with global hypokinesis.  . Cardiac catheterization  08/31/2013    Angiographically normal coronary arteries with EF 35-40%    No Known Allergies  Current Outpatient Prescriptions  Medication Sig Dispense Refill  . aspirin EC 81 MG EC tablet Take 1 tablet (81 mg total) by mouth daily.  30 tablet  0  .  carvedilol (COREG) 3.125 MG tablet Take 1 tablet (3.125 mg total) by mouth 2 (two) times daily with a meal.  180 tablet  1  . insulin glargine (LANTUS) 100 UNIT/ML injection Inject 0.2 mLs (20 Units total) into the skin at bedtime.  10 mL  2  . lisinopril (PRINIVIL,ZESTRIL) 20 MG tablet Take 1 tablet (20 mg total) by mouth daily.  90 tablet  1  . metFORMIN (GLUCOPHAGE) 1000 MG tablet Take 1 tablet (1,000 mg total) by mouth 2  (two) times daily with a meal.  60 tablet  2  . Multiple Vitamin (MULTIVITAMIN) tablet Take 1 tablet by mouth daily.      . simvastatin (ZOCOR) 40 MG tablet Take 1 tablet (40 mg total) by mouth daily.  30 tablet  2   No current facility-administered medications for this visit.    History   Social History Narrative   He is married. Does not drink and does not smoke alcohol.   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   ROS: A comprehensive Review of Systems - Negative except Mild muscular skeletal aches and pains but otherwise is negative.  PHYSICAL EXAM BP 122/76  Pulse 84  Ht 5\' 9"  (1.753 m)  Wt 284 lb 8 oz (129.048 kg)  BMI 41.99 kg/m2 General appearance: alert, cooperative, appears stated age, no distress, morbidly obese and Well-nourished and well-groomed. Neck: no adenopathy, no carotid bruit, no JVD, supple, symmetrical, trachea midline and thyroid not enlarged, symmetric, no tenderness/mass/nodules Lungs: clear to auscultation bilaterally, normal percussion bilaterally and Nonlabored, good movement. No W./R./R. Heart: regular rate and rhythm, S1, S2 normal, no murmur, click, rub or gallop and normal apical impulse Abdomen: soft, non-tender; bowel sounds normal; no masses,  no organomegaly Extremities: extremities normal, atraumatic, no cyanosis or edema and no edema, redness or tenderness in the calves or thighs Pulses: 2+ and symmetric Neurologic: Alert and oriented X 3, normal strength and tone. Normal symmetric reflexes. Normal coordination and gait  ECG not performed   Recent Labs:  no new labs   ASSESSMENT / PLAN: Stable - no med changes.  Recheck Echo 1 yr out.  No active SSx of CHF   Nonischemic cardiomyopathy - lock to be viral; EF 35-40% Relatively stable with no active symptoms. NYHA Class II. As expected his echo did not show any improvement. He is not having active heart failure symptoms. Some of his discomfort is just may very  well be related to diastolic dysfunction. He is on a good dose of carvedilol than I am reluctant to increase at this time, but would consider increasing to 6.25 twice a day if his blood pressure remains above 120 mm systolic with heart rates greater than 70. He is on good dose of lisinopril. Not currently requiring any ACE inhibitor.  Plan to repeat echocardiogram at roughly one year from original echo.  Hypertension Well-controlled. Would anticipate increasing carvedilol at followup visit.  Hyperlipidemia On statin. Had been monitored by PCP.  Chest tightness or pressure -with exertion No evidence of CAD on his calf. This is probably related to diastolic dysfunction. If this persists will increase carvedilol.   No orders of the defined types were placed in this encounter.   No orders of the defined types were placed in this encounter.    Followup: July-Aug 2015  Felishia Wartman W. Herbie Baltimore, M.D., M.S. Interventional Cardiology [facility]

## 2013-11-01 ENCOUNTER — Encounter: Payer: Self-pay | Admitting: Cardiology

## 2013-11-01 NOTE — Assessment & Plan Note (Addendum)
Relatively stable with no active symptoms. NYHA Class II. As expected his echo did not show any improvement. He is not having active heart failure symptoms. Some of his discomfort is just may very well be related to diastolic dysfunction. He is on a good dose of carvedilol than I am reluctant to increase at this time, but would consider increasing to 6.25 twice a day if his blood pressure remains above 120 mm systolic with heart rates greater than 70. He is on good dose of lisinopril. Not currently requiring any ACE inhibitor.  Plan to repeat echocardiogram at roughly one year from original echo.

## 2013-11-01 NOTE — Assessment & Plan Note (Signed)
Well-controlled. Would anticipate increasing carvedilol at followup visit.

## 2013-11-01 NOTE — Assessment & Plan Note (Signed)
No evidence of CAD on his calf. This is probably related to diastolic dysfunction. If this persists will increase carvedilol.

## 2013-11-01 NOTE — Assessment & Plan Note (Signed)
On statin. Had been monitored by PCP.

## 2013-12-11 ENCOUNTER — Encounter: Payer: BC Managed Care – PPO | Attending: Physician Assistant | Admitting: *Deleted

## 2013-12-11 ENCOUNTER — Encounter: Payer: Self-pay | Admitting: *Deleted

## 2013-12-11 VITALS — Ht 69.0 in | Wt 287.7 lb

## 2013-12-11 DIAGNOSIS — Z713 Dietary counseling and surveillance: Secondary | ICD-10-CM | POA: Insufficient documentation

## 2013-12-11 DIAGNOSIS — E119 Type 2 diabetes mellitus without complications: Secondary | ICD-10-CM

## 2013-12-11 NOTE — Patient Instructions (Signed)
Plan:  Aim for 4 Carb Choices per meal (60 grams) +/- 1 either way  Aim for 0-2 Carbs per snack if hungry  Include protein in moderation with your meals and snacks Consider  increasing your activity level by walking, swimming or Arm Chair Exercises  for 10-15 minutes daily as tolerated Consider checking BG at alternate times per day   Continue taking medication Lantus and Metformin as directed by MD

## 2013-12-11 NOTE — Progress Notes (Signed)
Appt start time: 1400 end time:  1530.  Assessment:  Patient was seen on  12/11/13 for individual diabetes and cardiac education. Lives with wife, she does the food shopping and most of the cooking. He states she is supportive of him. SMBG once a day in the evening or more as needed. He reports BGs between 115 and 175 mg/dl. He had heart surgery about 2 months ago. He states he is not in any Cardiac Rehab but has been encouraged to start walking as tolerated. 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   Current HbA1c: 7.3% (down from over 11% last year!)  Preferred Learning Style:   No preference indicated   Learning Readiness:   Ready  Change in progress  MEDICATIONS: see list, diabetes medications are Lantus @ 20 units at bedtime and Metformin  DIETARY INTAKE:  24-hr recall:  B ( AM): Toaster Strudel, chicken biscuit, lemonade OR egg whites, [place] sausage, water OR sweetened or high fiber cereal with Almond Milk Snk ( AM): no  L ( PM): brings from home or buys sesame chicken OR 12" grilled chicken sub sandwich with chips, lemonade or water Snk ( PM): no D ( PM): lean meat, starch, vegetables, water Snk ( PM): sandwich with lean meat Beverages: water, lemonade, flavored water  Usual physical activity: some walking, interested in getting into the pool  Estimated energy needs: 1800 calories 200 g carbohydrates 135 g protein 50 g fat  Progress Towards Goal(s):  In progress.   Nutritional Diagnosis:  NB-1.1 Food and nutrition-related knowledge deficit As related to diabetes control.  As evidenced by A1c of 7.3 %.    Intervention:  Nutrition counseling provided.  Discussed diabetes disease process and treatment options.  Discussed physiology of diabetes and role of obesity on insulin resistance.  Encouraged moderate weight reduction to improve glucose levels.  Discussed role of medications and diet in glucose control  Provided education  on macronutrients on glucose levels.  Provided education on carb counting, importance of regularly scheduled meals/snacks, and meal planning  Discussed effects of physical activity on glucose levels and long-term glucose control.  Recommended daily physical activity/week as tolerated.  Reviewed patient medications.  Discussed role of medication on blood glucose and possible side effects. Discussed blood glucose monitoring and interpretation.  Discussed recommended target ranges and individual ranges.    Described short-term complications: hyper- and hypo-glycemia.  Discussed causes,symptoms, and treatment options especially with him taking insulin..  Discussed prevention, detection, and treatment of long-term complications.  Discussed the role of prolonged elevated glucose levels on body systems.  Discussed role of stress on blood glucose levels and discussed strategies to manage psychosocial issues.  Discussed recommendations for long-term diabetes self-care.  Provided checklist for medical, dental, and emotional self-care.  Plan:  Aim for 4 Carb Choices per meal (60 grams) +/- 1 either way  Aim for 0-2 Carbs per snack if hungry  Include protein in moderation with your meals and snacks Consider  increasing your activity level by walking, swimming or Arm Chair Exercises  for 10-15 minutes daily as tolerated Consider checking BG at alternate times per day   Continue taking medication Lantus and Metformin as directed by MD  Teaching Method Utilized: Visual, Auditory and Hands on  Handouts given during visit include: Living Well with Diabetes Carb Counting and Food Label handouts Meal Plan Card  Barriers to learning/adherence to lifestyle change: none  Diabetes self-care support plan:  [facility] support group  Demonstrated degree of understanding via:  Teach Back   Monitoring/Evaluation:  Dietary intake, exercise, SMBG, and body weight in 1 month(s).

## 2014-01-24 ENCOUNTER — Encounter: Payer: BC Managed Care – PPO | Attending: Physician Assistant | Admitting: *Deleted

## 2014-01-24 ENCOUNTER — Encounter: Payer: Self-pay | Admitting: *Deleted

## 2014-01-24 DIAGNOSIS — Z713 Dietary counseling and surveillance: Secondary | ICD-10-CM | POA: Insufficient documentation

## 2014-01-24 DIAGNOSIS — E119 Type 2 diabetes mellitus without complications: Secondary | ICD-10-CM

## 2014-01-24 NOTE — Patient Instructions (Signed)
Plan:  Continue to aim for 4 Carb Choices per meal (60 grams) +/- 1 either way  Continue to aim for 0-2 Carbs per snack if hungry  Include protein in moderation with your meals and snacks Continue with your activity level by walking, jogging, swimming or Arm Chair Exercises  for 20-60 minutes daily as tolerated Continue checking BG at alternate times per day   Continue taking medication Lantus and Metformin as directed by MD

## 2014-01-24 NOTE — Progress Notes (Signed)
Appt start time: 1400 end time:  1430.  Assessment:  Patient was seen on  01/24/14 for individual diabetes and cardiac education follow up visit. He has increased his activity and joined a jogging group when they run in the evenings. He is counting his carbs more often now, and is reading food labels more. Continues to SMBG with reported range of 76-150 mg/dl now including before and after meals.  Current HbA1c: 7.3% (down from over 11% last year!)  Preferred Learning Style:   No preference indicated   Learning Readiness:   Ready  Change in progress  MEDICATIONS: see list, diabetes medications are Lantus @ 20 units at bedtime and Metformin  DIETARY INTAKE:  24-hr recall:  B ( AM): Toaster Strudel, chicken biscuit, lemonade OR egg whites, [place] sausage, water OR sweetened or high fiber cereal with Almond Milk Snk ( AM): no  L ( PM): brings from home or buys sesame chicken OR 12" grilled chicken sub sandwich with chips, lemonade or water Snk ( PM): no D ( PM): lean meat, starch, vegetables, water Snk ( PM): sandwich with lean meat Beverages: water, lemonade, flavored water  Usual physical activity: some walking, interested in getting into the pool  Estimated energy needs: 1800 calories 200 g carbohydrates 135 g protein 50 g fat  Progress Towards Goal(s):  In progress.   Nutritional Diagnosis:  NB-1.1 Food and nutrition-related knowledge deficit As related to diabetes control.  As evidenced by A1c of 7.3 %.    Intervention:  Nutrition counseling follow up provided. Commended patient on his increase in activity level and improved eating habits. Encouraged him to take his Lantus at a more consistent time in the evenings. Reviewed the target ranges for pre and post meal BGs as well as Blood Pressure guidelines. He is within these target ranges now.   Plan:  Continue to aim for 4 Carb Choices per meal (60 grams) +/- 1 either way  Continue to aim for 0-2 Carbs per snack if hungry   Include protein in moderation with your meals and snacks Continue with your activity level by walking, jogging, swimming or Arm Chair Exercises  for 20-60 minutes daily as tolerated Continue checking BG at alternate times per day   Continue taking medication Lantus and Metformin as directed by MD   Teaching Method Utilized: Visual, Auditory and Hands on  Handouts given during visit include: Health Goals handout  Barriers to learning/adherence to lifestyle change: none  Diabetes self-care support plan:   [facility] support group available  Demonstrated degree of understanding via:  Teach Back   Monitoring/Evaluation:  Dietary intake, exercise, SMBG, and body weight in 3 month(s).

## 2014-02-09 ENCOUNTER — Other Ambulatory Visit: Payer: Self-pay | Admitting: Physician Assistant

## 2014-04-26 ENCOUNTER — Encounter: Payer: BC Managed Care – PPO | Attending: Physician Assistant | Admitting: *Deleted

## 2014-04-26 VITALS — Ht 69.0 in | Wt 291.1 lb

## 2014-04-26 DIAGNOSIS — E119 Type 2 diabetes mellitus without complications: Secondary | ICD-10-CM | POA: Diagnosis not present

## 2014-04-26 DIAGNOSIS — Z713 Dietary counseling and surveillance: Secondary | ICD-10-CM | POA: Diagnosis not present

## 2014-04-26 DIAGNOSIS — Z794 Long term (current) use of insulin: Secondary | ICD-10-CM | POA: Insufficient documentation

## 2014-04-26 NOTE — Patient Instructions (Signed)
Plan:  Continue to aim for 4 Carb Choices per meal (60 grams) +/- 1 either way  Continue to aim for 0-2 Carbs per snack if hungry  Include protein in moderation with your meals and snacks Continue with your activity level by walking, jogging, swimming or Arm Chair Exercises  for 20-60 minutes daily as tolerated Continue checking BG at alternate times per day   Continue taking medication Lantus and Metformin as directed by MD With any weight loss, if you start having low BG's, call MD to notify them so they can reduce your Lantus insulin dose.

## 2014-04-26 NOTE — Progress Notes (Signed)
Appt start time: 1400 end time:  1430.  Assessment:  Patient was seen on  04/26/14 for individual diabetes and cardiac education follow up visit. He was nervous about his weight and that he had a post supper BG of 159 mg/dl last night. He has had a cold recently and was not able to go to work or to exercise for a couple of weeks. He continues to SMBG daily with reported range of 79-159 mg/dl both pre and post meals. He continues to take his Metformin and Lantus @ 20 units at bedtime. He continues to be comfortable with Carb Counting  Current HbA1c: 7.3% (down from over 11% last year!)  Preferred Learning Style:   No preference indicated   Learning Readiness:   Ready  Change in progress  MEDICATIONS: see list, diabetes medications are Lantus @ 20 units at bedtime and Metformin  DIETARY INTAKE:  24-hr recall:  B ( AM): no more Toaster Strudel, Has chicken biscuit, lemonade OR egg whites, [place] sausage, water  Snk ( AM): no  L ( PM): brings from home or buys sesame chicken OR 12" grilled chicken sub sandwich with chips, unsweetened green tea or water Snk ( PM): no D ( PM): lean meat, starch, vegetables, water Snk ( PM): sandwich with lean meat Beverages: water, flavored water  Usual physical activity: physically active at work and doing some walking, along with neighbors who walk or jog in the evenings, interested in getting into the pool  Estimated energy needs: 1800 calories 200 g carbohydrates 135 g protein 50 g fat  Progress Towards Goal(s):  In progress.   Nutritional Diagnosis:  NB-1.1 Food and nutrition-related knowledge deficit As related to diabetes control.  As evidenced by A1c of 7.3 % now down to 6.5%!    Intervention:  Nutrition counseling follow up provided.  Commended patient on his increase in activity level and improved eating habits.  Reviewed the following: Target ranges for BG both pre and post meals Sick Day rules Insulin Action Impact of weight loss  on insulin requirements and that he should call MD when he loses enough weight that he starts having hypoglycemia  Plan:  Continue to aim for 4 Carb Choices per meal (60 grams) +/- 1 either way  Continue to aim for 0-2 Carbs per snack if hungry  Include protein in moderation with your meals and snacks Continue with your activity level by walking, jogging, swimming or Arm Chair Exercises  for 20-60 minutes daily as tolerated Continue checking BG at alternate times per day   Continue taking medication Lantus and Metformin as directed by MD With any weight loss, if you start having low BG's, call MD to notify them so they can reduce your Lantus insulin dose.   Teaching Method Utilized: Visual, Auditory   Handouts given during visit include:  Living Well with Diabetes  Barriers to learning/adherence to lifestyle change: none  Diabetes self-care support plan:   [facility] support group available  Demonstrated degree of understanding via:  Teach Back   Monitoring/Evaluation:  Dietary intake, exercise, SMBG, and body weight in 3 month(s).

## 2014-05-08 ENCOUNTER — Other Ambulatory Visit: Payer: Self-pay | Admitting: Physician Assistant

## 2014-05-11 ENCOUNTER — Other Ambulatory Visit: Payer: Self-pay

## 2014-07-05 ENCOUNTER — Encounter (HOSPITAL_COMMUNITY): Payer: Self-pay | Admitting: Cardiovascular Disease

## 2014-07-09 ENCOUNTER — Other Ambulatory Visit: Payer: Self-pay

## 2014-07-09 DIAGNOSIS — I10 Essential (primary) hypertension: Secondary | ICD-10-CM

## 2014-07-09 MED ORDER — LISINOPRIL 20 MG PO TABS: 20.0000 mg | ORAL_TABLET | Freq: Every day | ORAL | Status: DC

## 2014-07-09 MED ORDER — CARVEDILOL 3.125 MG PO TABS: 3.1250 mg | ORAL_TABLET | Freq: Two times a day (BID) | ORAL | Status: DC

## 2014-07-29 ENCOUNTER — Other Ambulatory Visit: Payer: Self-pay | Admitting: Physician Assistant

## 2014-07-31 ENCOUNTER — Other Ambulatory Visit: Payer: Self-pay

## 2014-07-31 DIAGNOSIS — E119 Type 2 diabetes mellitus without complications: Secondary | ICD-10-CM

## 2014-07-31 MED ORDER — METFORMIN HCL 1000 MG PO TABS: 1000.0000 mg | ORAL_TABLET | Freq: Two times a day (BID) | ORAL | Status: DC

## 2014-07-31 NOTE — Telephone Encounter (Signed)
Faxed Rx in

## 2014-08-01 ENCOUNTER — Encounter: Payer: BLUE CROSS/BLUE SHIELD | Attending: Physician Assistant | Admitting: *Deleted

## 2014-08-01 VITALS — Ht 69.0 in | Wt 283.0 lb

## 2014-08-01 DIAGNOSIS — Z713 Dietary counseling and surveillance: Secondary | ICD-10-CM | POA: Insufficient documentation

## 2014-08-01 DIAGNOSIS — E119 Type 2 diabetes mellitus without complications: Secondary | ICD-10-CM | POA: Diagnosis not present

## 2014-08-01 DIAGNOSIS — Z794 Long term (current) use of insulin: Secondary | ICD-10-CM | POA: Diagnosis not present

## 2014-08-01 NOTE — Progress Notes (Signed)
Appt start time: 1400 end time:  1430.  Assessment:  Patient was seen on  08/01/14 for individual diabetes and cardiac education follow up visit. He states his BG have improved ranging in low to mid 100's pre and post meals. He is not using Carb Counting but states he is making healthier decisions. He chooses lean meats and mostly vegetables. He is drinking about a gallon of water a day and he is aiming for 10,000 steps a day.  Current HbA1c: 7.3% (down from over 11% last year!)  Preferred Learning Style:   No preference indicated   Learning Readiness:   Ready  Change in progress  MEDICATIONS: see list, diabetes medications are Lantus @ 20 units at bedtime and Metformin  DIETARY INTAKE:  24-hr recall:  B ( AM):   Shake with spinach, fresh fruit and asparagus, OR eggs, fresh fruit, water to drink Snk ( AM): no  L ( PM): brings from home or buys sesame chicken OR 12" grilled chicken sub sandwich with chips, unsweetened green tea or water Snk ( PM): no D ( PM): lean meat, starch, vegetables, water Snk ( PM): sandwich with lean meat Beverages: water, flavored water  Usual physical activity: physically active at work and doing some walking, along with neighbors who walk or jog in the evenings, interested in getting into the pool  Estimated energy needs: 1800 calories 200 g carbohydrates 135 g protein 50 g fat  Progress Towards Goal(s):  In progress.   Nutritional Diagnosis:  NB-1.1 Food and nutrition-related knowledge deficit As related to diabetes control.  As evidenced by A1c of 7.3 % now down to 6.5%!    Intervention:  Nutrition counseling follow up provided.  Commended patient on his improved eating habits and encouraged him to increase his activity level by using Arm Chair Exercises when it is too cold to walk outside. Reviewed Label Reading today and provided another yellow Meal Plan Card to reinforce the use of Carb Choices as part of portion control.   Plan:  Continue  to aim for 4 Carb Choices per meal (60 grams) +/- 1 either way  Continue to aim for 0-2 Carbs per snack if hungry  Include protein in moderation with your meals and snacks Continue with your activity level by walking, or Arm Chair Exercises  for 20-60 minutes daily as tolerated Continue checking BG at alternate times per day   Continue taking medication Lantus and Metformin as directed by MD With any weight loss, if you start having low BG's, call MD to notify them so they can reduce your Lantus insulin dose.    Teaching Method Utilized: Visual, Auditory   Handouts given during visit include:  Living Well with Diabetes  Barriers to learning/adherence to lifestyle change: none  Diabetes self-care support plan:   [facility] support group available  Demonstrated degree of understanding via:  Teach Back   Monitoring/Evaluation:  Dietary intake, exercise, SMBG, and body weight in 3 month(s).

## 2014-08-01 NOTE — Patient Instructions (Signed)
Plan:  Continue to aim for 4 Carb Choices per meal (60 grams) +/- 1 either way  Continue to aim for 0-2 Carbs per snack if hungry  Include protein in moderation with your meals and snacks Continue with your activity level by walking, or Arm Chair Exercises  for 20-60 minutes daily as tolerated Continue checking BG at alternate times per day   Continue taking medication Lantus and Metformin as directed by MD With any weight loss, if you start having low BG's, call MD to notify them so they can reduce your Lantus insulin dose.

## 2014-09-04 ENCOUNTER — Other Ambulatory Visit: Payer: Self-pay | Admitting: Physician Assistant

## 2014-09-07 ENCOUNTER — Other Ambulatory Visit: Payer: Self-pay | Admitting: Physician Assistant

## 2014-09-07 ENCOUNTER — Ambulatory Visit (INDEPENDENT_AMBULATORY_CARE_PROVIDER_SITE_OTHER): Payer: BLUE CROSS/BLUE SHIELD | Admitting: Physician Assistant

## 2014-09-07 VITALS — BP 136/88 | HR 81 | Temp 98.1°F | Resp 18 | Ht 70.0 in | Wt 285.0 lb

## 2014-09-07 DIAGNOSIS — E119 Type 2 diabetes mellitus without complications: Secondary | ICD-10-CM

## 2014-09-07 DIAGNOSIS — Z23 Encounter for immunization: Secondary | ICD-10-CM

## 2014-09-07 DIAGNOSIS — I10 Essential (primary) hypertension: Secondary | ICD-10-CM

## 2014-09-07 DIAGNOSIS — E785 Hyperlipidemia, unspecified: Secondary | ICD-10-CM

## 2014-09-07 DIAGNOSIS — G43009 Migraine without aura, not intractable, without status migrainosus: Secondary | ICD-10-CM

## 2014-09-07 LAB — POCT GLYCOSYLATED HEMOGLOBIN (HGB A1C): HEMOGLOBIN A1C: 6.8

## 2014-09-07 MED ORDER — METFORMIN HCL 1000 MG PO TABS: ORAL_TABLET | ORAL | Status: DC

## 2014-09-07 MED ORDER — CARVEDILOL 3.125 MG PO TABS: ORAL_TABLET | ORAL | Status: DC

## 2014-09-07 MED ORDER — GLUCOSE BLOOD VI STRP: ORAL_STRIP | Status: DC

## 2014-09-07 MED ORDER — LISINOPRIL 20 MG PO TABS: ORAL_TABLET | ORAL | Status: DC

## 2014-09-07 MED ORDER — SIMVASTATIN 40 MG PO TABS: 40.0000 mg | ORAL_TABLET | Freq: Every day | ORAL | Status: DC

## 2014-09-07 NOTE — Patient Instructions (Addendum)
Schedule you yearly eye exam.

## 2014-09-07 NOTE — Progress Notes (Signed)
Subjective:    Patient ID: Sean Rogers, male    DOB: 1981-06-11, 34 y.o.   MRN: [id number]  HPI Pt presents to clinic for his DM check.  He has been doing well.  He has not been seen in a while. He stopped his Lantus about 3 months ago because his blood glucose at home has been running low 100's fasting.  He has an active job but he does no outside exercise.  He does not really try to eat healthy.  He has no pain or paresthesias in his feet. No CP or SOB.  He feels good.   He also would like to talk about headaches that he has.  He does not have  Headaches often but when he does he is nauseated and photophobic and phonophobic.  He takes a motrin 400mg  and goes to sleep for a few hours and then he feels much better.  The headaches are frontal and throbbing.  His sister has migraines.  This is the only type of headache that he gets.  He had 2 of these headaches last week and he took Motrin 400mg  and then a few hour nap and woke up and felt great.  He is not worried about these headaches he just wants to make sure there is nothing different he should be doing.  Eye exam - no eye exam this year  Review of Systems  Cardiovascular: Negative for chest pain, palpitations and leg swelling.  Neurological: Positive for headaches. Negative for dizziness and numbness.   Patient Active Problem List   Diagnosis Date Noted  . Headache(784.0) 08/31/2013  . Chest pain with moderate risk for cardiac etiology 08/30/2013  . Dyspnea on exertion 08/30/2013  . Nonischemic cardiomyopathy - lock to be viral; EF 35-40% 08/30/2013    Class: Diagnosis of  . Chest pain 08/29/2013  . Chest tightness or pressure -with exertion 08/29/2013  . Type 2 diabetes mellitus, controlled 08/05/2011  . Obesity 08/05/2011  . Hypertension 08/05/2011  . Hyperlipidemia 08/05/2011   Prior to Admission medications   Medication Sig Start Date End Date Taking? Authorizing Provider  aspirin EC 81 MG EC tablet Take 1 tablet (81 mg  total) by mouth daily. 09/01/13  Yes Belkys A Regalado, MD  carvedilol (COREG) 3.125 MG tablet TAKE ONE TABLET BY MOUTH TWICE DAILY. TAKE WITH A MEAL. 09/07/14  Yes Sarah Harvie Bridge, PA-C  lisinopril (PRINIVIL,ZESTRIL) 20 MG tablet TAKE ONE TABLET BY MOUTH ONE TIME DAILY. 09/07/14  Yes Morrell Riddle, PA-C  metFORMIN (GLUCOPHAGE) 1000 MG tablet TAKE ONE TABLET BY MOUTH TWICE DAILY WITH MEALS. 09/07/14  Yes Morrell Riddle, PA-C  simvastatin (ZOCOR) 40 MG tablet Take 1 tablet (40 mg total) by mouth daily. 09/07/14  Yes Morrell Riddle, PA-C  glucose blood test strip Use as instructed 09/07/14   Morrell Riddle, PA-C  Multiple Vitamin (MULTIVITAMIN) tablet Take 1 tablet by mouth daily.    Historical Provider, MD   No Known Allergies  Medications, allergies, past medical history, surgical history, family history, social history and problem list reviewed and updated.      Objective:   Physical Exam  Constitutional: He is oriented to person, place, and time. He appears well-developed and well-nourished.  BP 136/88 mmHg  Pulse 81  Temp(Src) 98.1 F (36.7 C) (Oral)  Resp 18  Ht 5\' 10"  (1.778 m)  Wt 285 lb (129.275 kg)  BMI 40.89 kg/m2  SpO2 98%   HENT:  Head: Normocephalic and atraumatic.  Right Ear: External ear normal.  Left Ear: External ear normal.  Eyes: Conjunctivae, EOM and lids are normal. Pupils are equal, round, and reactive to light.  Cardiovascular: Normal rate, regular rhythm and normal heart sounds.   Pulmonary/Chest: Effort normal and breath sounds normal.  Musculoskeletal: Normal range of motion.  Diabetic Foot Exam - Simple   Simple Foot Form  Diabetic Foot exam was performed with the following findings:  Yes  09/07/2014  5:30 AM  Visual Inspection  No deformities, no ulcerations, no other skin breakdown bilaterally:  Yes  Sensation Testing  Intact to touch and monofilament testing bilaterally:  Yes  Pulse Check  Posterior Tibialis and Dorsalis pulse intact bilaterally:  Yes    Comments  Very dry plantar surfaces      Neurological: He is alert and oriented to person, place, and time. He has normal strength. No sensory deficit.  Reflex Scores:      Bicep reflexes are 2+ on the right side and 2+ on the left side.      Brachioradialis reflexes are 2+ on the right side and 2+ on the left side.      Patellar reflexes are 2+ on the right side and 2+ on the left side.      Achilles reflexes are 2+ on the right side and 2+ on the left side. Skin: Skin is warm and dry.  Psychiatric: He has a normal mood and affect. His behavior is normal. Judgment and thought content normal.   Results for orders placed or performed in visit on 09/07/14  POCT glycosylated hemoglobin (Hb A1C)  Result Value Ref Range   Hemoglobin A1C 6.8       Assessment & Plan:  Essential hypertension - Controlled. Plan: carvedilol (COREG) 3.125 MG tablet, lisinopril (PRINIVIL,ZESTRIL) 20 MG tablet  Obesity - class III - morbid obesity - we discussed good ide for weight loss options and adding exercising - this will only help him with his DM and HTN control  Hyperlipidemia - check labs -Plan: Lipid panel, simvastatin (ZOCOR) 40 MG tablet  Type 2 diabetes mellitus, controlled - A1C  Plan: COMPLETE METABOLIC PANEL WITH GFR, POCT glycosylated hemoglobin (Hb A1C), Microalbumin, urine, metFORMIN (GLUCOPHAGE) 1000 MG tablet, glucose blood test strip, HM DIABETES FOOT EXAM - recheck in 3 months   Flu vaccine need - Plan: Flu Vaccine QUAD 36+ mos IM  Headaches - seems migraine in style - he is happy with his response to Motrin and sleep and wants to continue with hia treatment - he will f/u with me if his symptoms change or the treatment stops working - if this regimen stops working we will start a triptan.  Benny Lennert PA-C  [facility] 09/08/2014 10:44 AM

## 2014-09-08 LAB — COMPLETE METABOLIC PANEL WITH GFR
ALK PHOS: 95 U/L (ref 39–117)
ALT: 19 U/L (ref 0–53)
AST: 19 U/L (ref 0–37)
Albumin: 4 g/dL (ref 3.5–5.2)
BUN: 14 mg/dL (ref 6–23)
CO2: 28 mEq/L (ref 19–32)
Calcium: 9.3 mg/dL (ref 8.4–10.5)
Chloride: 101 mEq/L (ref 96–112)
Creat: 1 mg/dL (ref 0.50–1.35)
GFR, Est African American: >89 mL/min
GLUCOSE: 161 mg/dL — AB (ref 70–99)
POTASSIUM: 4.5 meq/L (ref 3.5–5.3)
SODIUM: 136 meq/L (ref 135–145)
TOTAL PROTEIN: 7.3 g/dL (ref 6.0–8.3)
Total Bilirubin: 0.5 mg/dL (ref 0.2–1.2)

## 2014-09-08 LAB — LIPID PANEL
Cholesterol: 152 mg/dL (ref 0–200)
HDL: 48 mg/dL (ref >39)
LDL CALC: 92 mg/dL (ref 0–99)
TRIGLYCERIDES: 61 mg/dL (ref <150)
Total CHOL/HDL Ratio: 3.2 Ratio
VLDL: 12 mg/dL (ref 0–40)

## 2014-09-08 LAB — MICROALBUMIN, URINE: Microalb, Ur: 0.7 mg/dL (ref <2.0)

## 2014-11-01 ENCOUNTER — Encounter: Payer: Self-pay | Admitting: *Deleted

## 2014-11-01 ENCOUNTER — Encounter: Payer: BLUE CROSS/BLUE SHIELD | Attending: Physician Assistant | Admitting: *Deleted

## 2014-11-01 VITALS — Ht 70.0 in | Wt 287.3 lb

## 2014-11-01 DIAGNOSIS — E119 Type 2 diabetes mellitus without complications: Secondary | ICD-10-CM | POA: Diagnosis present

## 2014-11-01 DIAGNOSIS — Z794 Long term (current) use of insulin: Secondary | ICD-10-CM | POA: Insufficient documentation

## 2014-11-01 DIAGNOSIS — Z713 Dietary counseling and surveillance: Secondary | ICD-10-CM | POA: Diagnosis not present

## 2014-11-01 NOTE — Patient Instructions (Signed)
Plan:  Continue to aim for 4 Carb Choices per meal (60 grams) +/- 1 either way  Continue to aim for 0-2 Carbs per snack if hungry  Include protein in moderation with your meals and snacks Continue with your activity level by walking, or any other exercise you prefer Continue checking BG at alternate times per day   Continue taking medication Metformin as directed by MD

## 2014-11-01 NOTE — Progress Notes (Signed)
Appt start time: 1400 end time:  1430.  Assessment:  Patient was seen on  11/01/14 for individual diabetes and cardiac education follow up visit. He states he no longer needs to take insulin! His only diabetes medication is Metformin now. He states he is now SMBG every other day because he is almost out of strips with reported range of 130  -226 mg/dl. He states he is still making healthier decisions. He chooses lean meats and mostly vegetables. He is drinking about a gallon of water a day and he is aiming for 10,000 steps a day.  He is here with his wife today, who appears supportive. He wanted her to hear the information herself.   Current HbA1c: 6.5% (down from over 11% last year!)  Preferred Learning Style:   No preference indicated   Learning Readiness:   Ready  Change in progress  MEDICATIONS: see list, diabetes medications are Lantus @ 20 units at bedtime and Metformin  DIETARY INTAKE:  24-hr recall:  B ( AM):   Shake with spinach, fresh fruit and asparagus, OR eggs, fresh fruit, water to drink  Grits, [place] sausage, 2 boiled eggs without yolks and lots of water Snk ( AM): no  L ( PM): brings from home or buys sesame chicken OR 12" grilled chicken sub sandwich with chips, water Snk ( PM): no D ( PM): lean meat, starch, vegetables, water Snk ( PM): no more snacks at night Beverages: water, flavored water  Usual physical activity: physically active at work and doing some walking, along with neighbors who walk or jog in the evenings, interested in getting into the pool  Estimated energy needs: 1800 calories 200 g carbohydrates 135 g protein 50 g fat  Progress Towards Goal(s):  In progress.   Nutritional Diagnosis:  NB-1.1 Food and nutrition-related knowledge deficit As related to diabetes control.  As evidenced by A1c of 7.3 % now down to 6.5%!    Intervention:  Nutrition counseling follow up provided.  Commended patient on his continued improved eating habits and  increased activity level. Answered questions of his wife and the patient to clarify some food questions they had.   Plan:  Continue to aim for 4 Carb Choices per meal (60 grams) +/- 1 either way  Continue to aim for 0-2 Carbs per snack if hungry  Include protein in moderation with your meals and snacks Continue with your activity level by walking, or any other exercise you prefer Continue checking BG at alternate times per day   Continue taking medication Metformin as directed by MD     Teaching Method Utilized: Visual, Auditory   Handouts given during visit include:  Meal Plan Card  Barriers to learning/adherence to lifestyle change: none  Diabetes self-care support plan:   [facility] support group available  Demonstrated degree of understanding via:  Teach Back   Monitoring/Evaluation:  Dietary intake, exercise, SMBG, and body weight in 2 month(s).

## 2014-12-19 ENCOUNTER — Encounter: Payer: Self-pay | Admitting: Physician Assistant

## 2014-12-19 ENCOUNTER — Ambulatory Visit (INDEPENDENT_AMBULATORY_CARE_PROVIDER_SITE_OTHER): Payer: BLUE CROSS/BLUE SHIELD | Admitting: Physician Assistant

## 2014-12-19 VITALS — BP 147/87 | HR 91 | Temp 98.6°F | Resp 16 | Ht 69.5 in | Wt 282.0 lb

## 2014-12-19 DIAGNOSIS — E119 Type 2 diabetes mellitus without complications: Secondary | ICD-10-CM

## 2014-12-19 DIAGNOSIS — I10 Essential (primary) hypertension: Secondary | ICD-10-CM | POA: Diagnosis not present

## 2014-12-19 DIAGNOSIS — E785 Hyperlipidemia, unspecified: Secondary | ICD-10-CM

## 2014-12-19 LAB — LIPID PANEL
CHOL/HDL RATIO: 3.9 ratio
Cholesterol: 140 mg/dL (ref 0–200)
HDL: 36 mg/dL — AB (ref >=40)
LDL CALC: 84 mg/dL (ref 0–99)
TRIGLYCERIDES: 100 mg/dL (ref <150)
VLDL: 20 mg/dL (ref 0–40)

## 2014-12-19 LAB — COMPLETE METABOLIC PANEL WITH GFR
ALK PHOS: 97 U/L (ref 39–117)
ALT: 20 U/L (ref 0–53)
AST: 17 U/L (ref 0–37)
Albumin: 4 g/dL (ref 3.5–5.2)
BUN: 11 mg/dL (ref 6–23)
CO2: 26 mEq/L (ref 19–32)
CREATININE: 0.94 mg/dL (ref 0.50–1.35)
Calcium: 9.5 mg/dL (ref 8.4–10.5)
Chloride: 99 mEq/L (ref 96–112)
GFR, Est African American: >89 mL/min
Glucose, Bld: 341 mg/dL — ABNORMAL HIGH (ref 70–99)
POTASSIUM: 4.5 meq/L (ref 3.5–5.3)
Sodium: 136 mEq/L (ref 135–145)
TOTAL PROTEIN: 7.2 g/dL (ref 6.0–8.3)
Total Bilirubin: 0.6 mg/dL (ref 0.2–1.2)

## 2014-12-19 LAB — POCT GLYCOSYLATED HEMOGLOBIN (HGB A1C): Hemoglobin A1C: 8.9

## 2014-12-19 MED ORDER — METFORMIN HCL 1000 MG PO TABS: ORAL_TABLET | ORAL | Status: DC

## 2014-12-19 MED ORDER — CARVEDILOL 3.125 MG PO TABS: ORAL_TABLET | ORAL | Status: DC

## 2014-12-19 MED ORDER — GLUCOSE BLOOD VI STRP: ORAL_STRIP | Status: DC

## 2014-12-19 MED ORDER — SIMVASTATIN 40 MG PO TABS: 40.0000 mg | ORAL_TABLET | Freq: Every day | ORAL | Status: DC

## 2014-12-19 NOTE — Progress Notes (Signed)
Subjective:    Patient ID: Sean Rogers, male    DOB: [birth year], 34 y.o.   MRN: [id number]  HPI Pt presents to clinic for his 3 month recheck.  He has not been doing well with his diet.  He does not check his sugar regularly but yesterday glucose was 293- he had left over insulin that he used that and he felt better afterwards.  He otherwise is doing ok.  Patient Active Problem List   Diagnosis Date Noted  . Headache(784.0) 08/31/2013  . Chest pain with moderate risk for cardiac etiology 08/30/2013  . Dyspnea on exertion 08/30/2013  . Nonischemic cardiomyopathy - lock to be viral; EF 35-40% 08/30/2013    Class: Diagnosis of  . Chest pain 08/29/2013  . Chest tightness or pressure -with exertion 08/29/2013  . Type 2 diabetes mellitus, controlled 08/05/2011  . Obesity, Class III, BMI 40-49.9 (morbid obesity) 08/05/2011  . Hypertension 08/05/2011  . Hyperlipidemia 08/05/2011    The patient has a current medication list which includes the following prescription(s): aspirin, carvedilol, glucose blood, lisinopril, loratadine-pseudoephedrine, metformin, multivitamin, and simvastatin.  No Known Allergies   Review of Systems  Constitutional: Negative for fever and chills.  Cardiovascular: Negative for chest pain and leg swelling.  Neurological: Negative for numbness.       Objective:   Physical Exam  Constitutional: He is oriented to person, place, and time. He appears well-developed and well-nourished.  BP 147/87 mmHg  Pulse 91  Temp(Src) 98.6 F (37 C)  Resp 16  Ht 5' 9.5" (1.765 m)  Wt 282 lb (127.914 kg)  BMI 41.06 kg/m2  SpO2 97%   HENT:  Head: Normocephalic and atraumatic.  Right Ear: External ear normal.  Left Ear: External ear normal.  Eyes: Conjunctivae are normal.  Neck: Normal range of motion.  Cardiovascular: Normal rate, regular rhythm and normal heart sounds.   No murmur heard. Pulmonary/Chest: Effort normal and breath sounds normal. He has no wheezes.    Neurological: He is alert and oriented to person, place, and time.  Diabetic Foot Form - Detailed   Diabetic Foot Exam - detailed  Diabetic Foot exam was performed with the following findings:  Yes  12/19/2014  2:11 PM  Visual Foot Exam completed.:  Yes  Is there a history of foot ulcer?:  No  Can the patient see the bottom of their feet?:  Yes  Are the shoes appropriate in style and fit?:  Yes  Is there swelling or and abnormal foot shape?:  No  Are the toenails long?:  No  Are the toenails thick?:  No  Do you have pain in calf while walking?:  No  Is there a claw toe deformity?:  No  Is there elevated skin temparature?:  No  Is there limited skin dorsiflexion?:  No  Is there foot or ankle muscle weakness?:  No  Are the toenails ingrown?:  No  Normal Range of Motion:  Yes    Pulse Foot Exam completed.:  Yes  Right posterior Tibialias:  Present Left posterior Tibialias:  Present  Right Dorsalis Pedis:  Present Left Dorsalis Pedis:  Present  Sensory Foot Exam Completed.:  Yes  Swelling:  No  Semmes-Weinstein Monofilament Test  R Foot Test Control:  Pos L Foot Test Control:  Pos  R Site 1-Great Toe:  Pos L Site 1-Great Toe:  Pos  R Site 4:  Pos L Site 4:  Pos  R Site 5:  Pos L Site 5:  Pos        Skin: Skin is warm and dry.  Psychiatric: He has a normal mood and affect. His behavior is normal. Judgment and thought content normal.       Assessment & Plan:  Essential hypertension - Plan: carvedilol (COREG) 3.125 MG tablet  Type 2 diabetes mellitus, controlled - Plan: POCT glycosylated hemoglobin (Hb A1C), COMPLETE METABOLIC PANEL WITH GFR, Lipid panel, metFORMIN (GLUCOPHAGE) 1000 MG tablet, glucose blood test strip, HM DIABETES FOOT EXAM  Hyperlipidemia - Plan: simvastatin (ZOCOR) 40 MG tablet   Continue current medications and we will adjust as indicated by his labs.  He will get back on his healthy eating and f/u with me in 3 months.  Benny Lennert PA-C  [facility] 12/19/2014 5:48 PM

## 2015-01-03 ENCOUNTER — Encounter: Payer: BLUE CROSS/BLUE SHIELD | Attending: Physician Assistant | Admitting: *Deleted

## 2015-01-03 VITALS — Ht 69.0 in | Wt 276.7 lb

## 2015-01-03 DIAGNOSIS — Z713 Dietary counseling and surveillance: Secondary | ICD-10-CM | POA: Insufficient documentation

## 2015-01-03 DIAGNOSIS — Z794 Long term (current) use of insulin: Secondary | ICD-10-CM | POA: Diagnosis not present

## 2015-01-03 DIAGNOSIS — E119 Type 2 diabetes mellitus without complications: Secondary | ICD-10-CM

## 2015-01-03 NOTE — Patient Instructions (Signed)
Plan:  Continue to aim for 4 Carb Choices per meal (60 grams) +/- 1 either way  Continue to aim for 0-2 Carbs per snack if hungry  Include protein in moderation with your meals and snacks Continue with your activity level by walking, or Soft Ball 2-3 days a week Continue checking BG at alternate times per day   Continue taking medication Metformin as directed, consider following up with your MD since you received your last A1c regarding any medication changes

## 2015-01-03 NOTE — Progress Notes (Signed)
Appt start time: 1630 end time:  1700.  Assessment:  Patient was seen on  01/03/15 for individual diabetes and cardiac education follow up visit. He states he had slipped on his exercise and healthy eating habits so his A1c increased to 8.9% on 12/19/14. He states he is resuming his healthier eating habits and has joined a soft ball team that plays twice a week. He is also walking 15-25 minutes each day. He states his BG dropped since he started increasing his exercise from 415 down to 256 mg/dl. He has not tested the last couple of days as he ran out of strips and is waiting for his Rx to be called into the Pharmacy. He has not spoken to his MD since he got the results of his last A1c so insulin has not been resumed yet.  Current HbA1c: 8.9%  Preferred Learning Style:   No preference indicated   Learning Readiness:   Ready  Change in progress  MEDICATIONS: see list, diabetes medication is Metformin. Insulin was discontinued over 3 months ago.  DIETARY INTAKE:  24-hr recall:  B ( AM):   Shake with spinach, fresh fruit and asparagus, OR eggs, fresh fruit, water to drink  Grits, [place] sausage, 2 boiled eggs without yolks and lots of water Snk ( AM): no  L ( PM): brings from home or buys sesame chicken OR 12" grilled chicken sub sandwich with chips, water Snk ( PM): no D ( PM): lean meat, starch, vegetables, water Snk ( PM): no more snacks at night Beverages: water, flavored water  Usual physical activity: physically active at work and doing some walking, along with neighbors who walk or jog in the evenings, interested in getting into the pool  Estimated energy needs: 1800 calories 200 g carbohydrates 135 g protein 50 g fat  Progress Towards Goal(s):  In progress.   Nutritional Diagnosis:  NB-1.1 Food and nutrition-related knowledge deficit As related to diabetes control.  As evidenced by A1c of 8.9%    Intervention:  Nutrition counseling follow up provided.  Commended patient  on his decision to resume more healthy eating habits and increased activity level. Discussed possibility that there may have been some physical changes in his body that could also be contributing to his worsening control. Strongly encouraged him to call his MD office to follow up on medication changes the Dr.might want him to take to improve his control immediately, not to wait until next visit.  Plan:  Continue to aim for 4 Carb Choices per meal (60 grams) +/- 1 either way  Continue to aim for 0-2 Carbs per snack if hungry  Include protein in moderation with your meals and snacks Continue with your activity level by walking, or Soft Ball 2-3 days a week Continue checking BG at alternate times per day   Continue taking medication Metformin as directed, consider following up with your MD since you received your last A1c regarding any medication changes      Teaching Method Utilized: Visual, Auditory   Handouts given during visit include:  Meal Plan Card  Barriers to learning/adherence to lifestyle change: none  Diabetes self-care support plan:   [facility] support group available  Demonstrated degree of understanding via:  Teach Back   Monitoring/Evaluation:  Dietary intake, exercise, SMBG, and body weight in 3 month(s).

## 2015-01-08 ENCOUNTER — Other Ambulatory Visit: Payer: Self-pay

## 2015-01-08 DIAGNOSIS — E119 Type 2 diabetes mellitus without complications: Secondary | ICD-10-CM

## 2015-01-08 IMAGING — CR DG CHEST 2V
2 series · 2 of 2 positions shown · non-contrast
Comparison: 05/02/2012.

CLINICAL DATA: Chest pain.

EXAM:
CHEST  2 VIEW

[w chest pa]
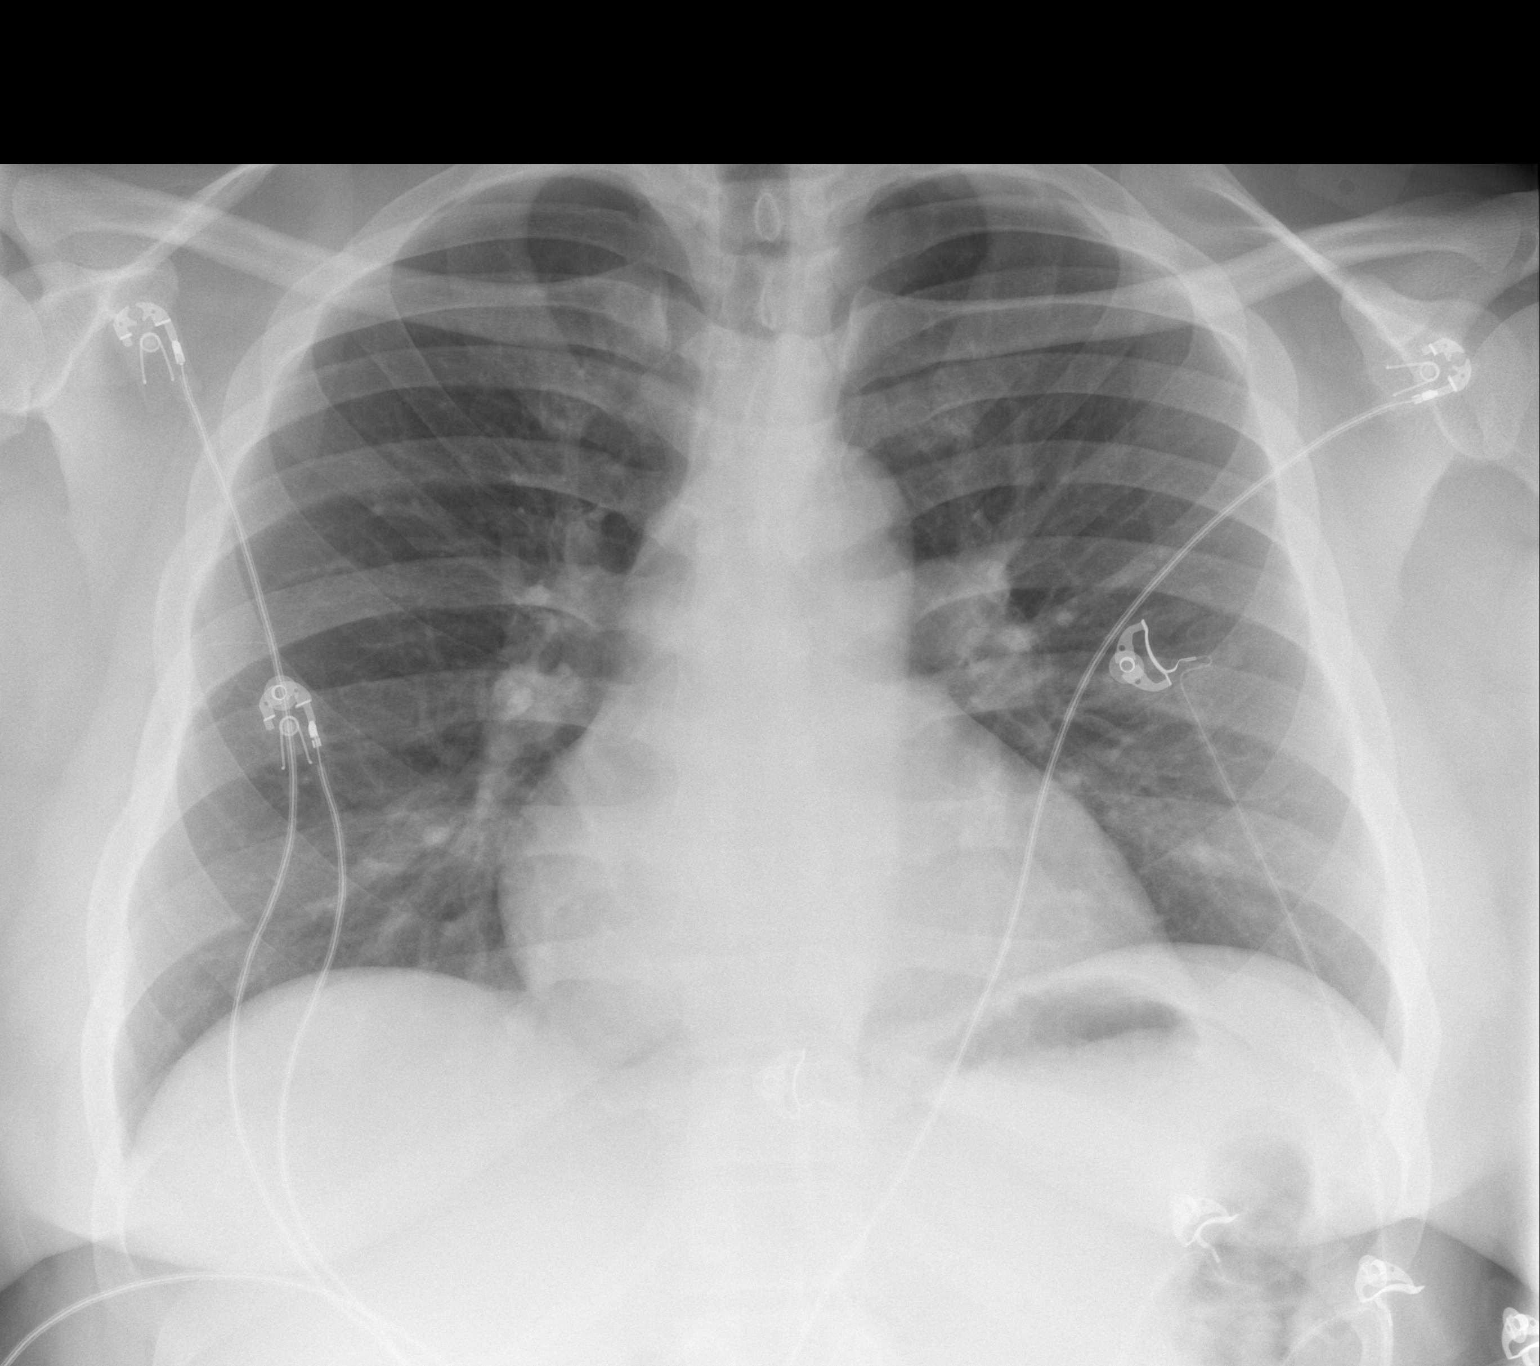

[w chest lat]
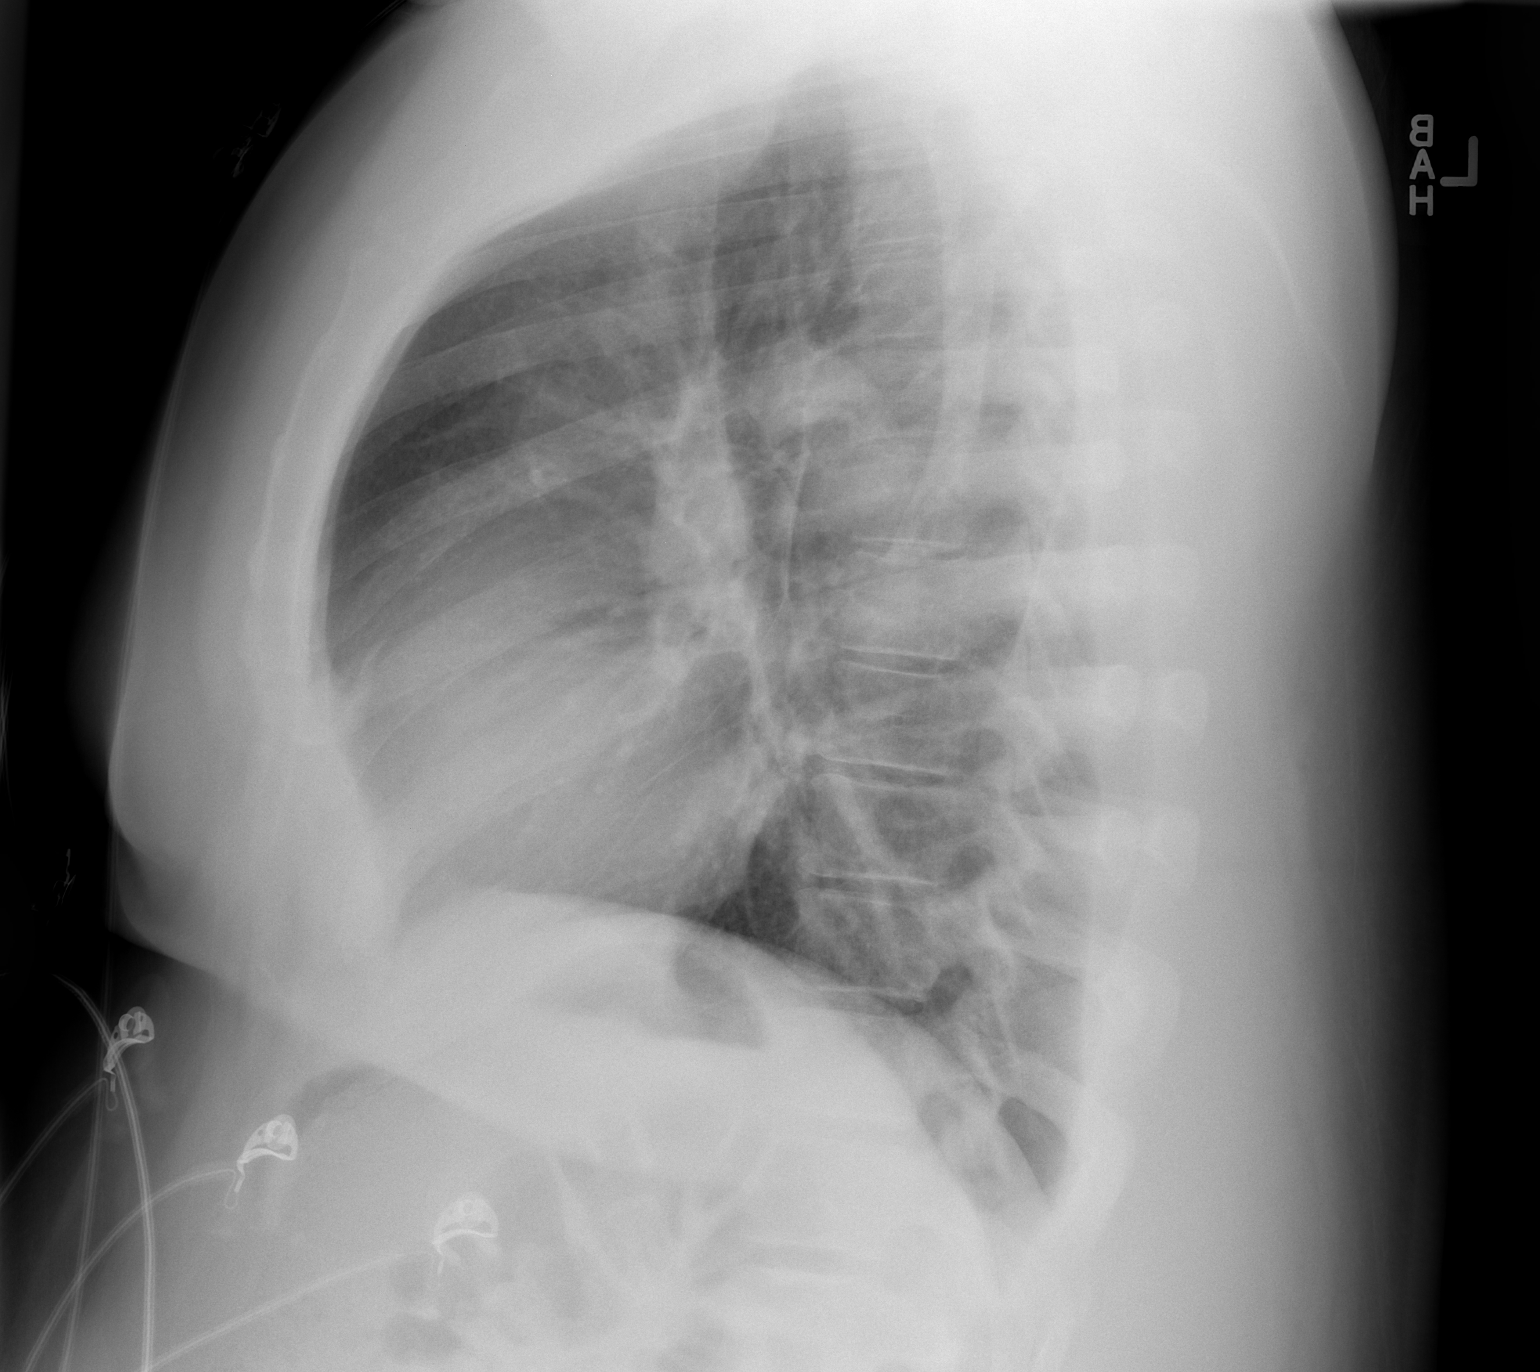

[2 of 2 positions shown; findings below may reference images not displayed]

FINDINGS: Poor inspiration. Lungs are clear. No pleural effusion or
pneumothorax. Stable heart size, no pulmonary venous congestion. No
acute bony abnormality .
IMPRESSION: No active cardiopulmonary disease.

## 2015-01-08 MED ORDER — GLUCOSE BLOOD VI STRP: ORAL_STRIP | Status: DC

## 2015-01-09 ENCOUNTER — Encounter: Payer: Self-pay | Admitting: *Deleted

## 2015-03-10 ENCOUNTER — Other Ambulatory Visit: Payer: Self-pay | Admitting: Physician Assistant

## 2015-03-28 ENCOUNTER — Ambulatory Visit (INDEPENDENT_AMBULATORY_CARE_PROVIDER_SITE_OTHER): Payer: BLUE CROSS/BLUE SHIELD | Admitting: Physician Assistant

## 2015-03-28 VITALS — BP 130/88 | HR 82 | Temp 98.0°F | Resp 16 | Ht 69.5 in | Wt 260.8 lb

## 2015-03-28 DIAGNOSIS — I10 Essential (primary) hypertension: Secondary | ICD-10-CM

## 2015-03-28 DIAGNOSIS — E119 Type 2 diabetes mellitus without complications: Secondary | ICD-10-CM

## 2015-03-28 DIAGNOSIS — E785 Hyperlipidemia, unspecified: Secondary | ICD-10-CM

## 2015-03-28 DIAGNOSIS — E1165 Type 2 diabetes mellitus with hyperglycemia: Secondary | ICD-10-CM

## 2015-03-28 DIAGNOSIS — IMO0002 Reserved for concepts with insufficient information to code with codable children: Secondary | ICD-10-CM

## 2015-03-28 DIAGNOSIS — Z23 Encounter for immunization: Secondary | ICD-10-CM | POA: Diagnosis not present

## 2015-03-28 LAB — COMPLETE METABOLIC PANEL WITH GFR
ALBUMIN: 4.2 g/dL (ref 3.6–5.1)
ALK PHOS: 89 U/L (ref 40–115)
ALT: 45 U/L (ref 9–46)
AST: 28 U/L (ref 10–40)
BILIRUBIN TOTAL: 0.8 mg/dL (ref 0.2–1.2)
BUN: 12 mg/dL (ref 7–25)
CALCIUM: 9.4 mg/dL (ref 8.6–10.3)
CO2: 28 mmol/L (ref 20–31)
CREATININE: 0.99 mg/dL (ref 0.60–1.35)
Chloride: 100 mmol/L (ref 98–110)
GFR, Est Non African American: >89 mL/min (ref >=60)
Glucose, Bld: 419 mg/dL — ABNORMAL HIGH (ref 65–99)
Potassium: 4.8 mmol/L (ref 3.5–5.3)
Sodium: 137 mmol/L (ref 135–146)
TOTAL PROTEIN: 6.9 g/dL (ref 6.1–8.1)

## 2015-03-28 LAB — POCT GLYCOSYLATED HEMOGLOBIN (HGB A1C): Hemoglobin A1C: 13.2

## 2015-03-28 MED ORDER — METFORMIN HCL 1000 MG PO TABS: ORAL_TABLET | ORAL | Status: DC

## 2015-03-28 MED ORDER — SIMVASTATIN 40 MG PO TABS: 40.0000 mg | ORAL_TABLET | Freq: Every day | ORAL | Status: DC

## 2015-03-28 MED ORDER — CARVEDILOL 3.125 MG PO TABS: ORAL_TABLET | ORAL | Status: DC

## 2015-03-28 MED ORDER — LISINOPRIL 20 MG PO TABS: ORAL_TABLET | ORAL | Status: DC

## 2015-03-28 NOTE — Progress Notes (Signed)
Sean Rogers  MRN: [id number] DOB: [birth year]  Subjective:  Pt presents to clinic for his DM recheck.  He has been having no problems.  He has not been checking his BS regularly.  He has been trying to decrease his portion size and he has been playing softball so he has been more active and he has had some weight loss.  Patient Active Problem List   Diagnosis Date Noted  . Headache(784.0) 08/31/2013  . Chest pain with moderate risk for cardiac etiology 08/30/2013  . Dyspnea on exertion 08/30/2013  . Nonischemic cardiomyopathy - lock to be viral; EF 35-40% 08/30/2013    Class: Diagnosis of  . Chest pain 08/29/2013  . Chest tightness or pressure -with exertion 08/29/2013  . Type 2 diabetes mellitus, uncontrolled 08/05/2011  . Obesity, Class II, BMI 35-39.9 08/05/2011  . Hypertension 08/05/2011  . Hyperlipidemia 08/05/2011    Current Outpatient Prescriptions on File Prior to Visit  Medication Sig Dispense Refill  . aspirin EC 81 MG EC tablet Take 1 tablet (81 mg total) by mouth daily. 30 tablet 0  . glucose blood test strip Use as instructed 100 each 12  . Multiple Vitamin (MULTIVITAMIN) tablet Take 1 tablet by mouth daily.     No current facility-administered medications on file prior to visit.    No Known Allergies  Review of Systems  Constitutional: Negative for fever and chills.  Respiratory: Negative for shortness of breath.   Cardiovascular: Negative for chest pain and leg swelling.  Neurological: Negative for weakness.       No paresthesias or pain burning sensation in feet - no known lesions on feet   Objective:  BP 130/88 mmHg  Pulse 82  Temp(Src) 98 F (36.7 C) (Oral)  Resp 16  Ht 5' 9.5" (1.765 m)  Wt 260 lb 12.8 oz (118.298 kg)  BMI 37.97 kg/m2  SpO2 97%  Physical Exam  Constitutional: He is oriented to person, place, and time and well-developed, well-nourished, and in no distress.  HENT:  Head: Normocephalic and atraumatic.  Right Ear: External ear  normal.  Left Ear: External ear normal.  Eyes: Conjunctivae are normal.  Neck: Normal range of motion.  Cardiovascular: Normal rate, regular rhythm and normal heart sounds.   Pulmonary/Chest: Effort normal and breath sounds normal.  Neurological: He is alert and oriented to person, place, and time. Gait normal.  Skin: Skin is warm and dry.  Psychiatric: Mood, memory, affect and judgment normal.   Results for orders placed or performed in visit on 03/28/15  COMPLETE METABOLIC PANEL WITH GFR  Result Value Ref Range   Sodium 137 135 - 146 mmol/L   Potassium 4.8 3.5 - 5.3 mmol/L   Chloride 100 98 - 110 mmol/L   CO2 28 20 - 31 mmol/L   Glucose, Bld 419 (H) 65 - 99 mg/dL   BUN 12 7 - 25 mg/dL   Creat 9.41 7.40 - 8.14 mg/dL   Total Bilirubin 0.8 0.2 - 1.2 mg/dL   Alkaline Phosphatase 89 40 - 115 U/L   AST 28 10 - 40 U/L   ALT 45 9 - 46 U/L   Total Protein 6.9 6.1 - 8.1 g/dL   Albumin 4.2 3.6 - 5.1 g/dL   Calcium 9.4 8.6 - 48.1 mg/dL   GFR, Est African American >89 >=60 mL/min   GFR, Est Non African American >89 >=60 mL/min  POCT glycosylated hemoglobin (Hb A1C)  Result Value Ref Range   Hemoglobin A1C 13.2  Assessment and Plan :  Diabetes mellitus without complication - Plan: POCT glycosylated hemoglobin (Hb A1C), COMPLETE METABOLIC PANEL WITH GFR, canagliflozin (INVOKANA) 100 MG TABS tablet  Flu vaccine need - Plan: Flu Vaccine QUAD 36+ mos IM  Hyperlipidemia - Plan: simvastatin (ZOCOR) 40 MG tablet  Essential hypertension - Plan: carvedilol (COREG) 3.125 MG tablet, lisinopril (PRINIVIL,ZESTRIL) 20 MG tablet  DM (diabetes mellitus), type 2, uncontrolled - Plan: metFORMIN (GLUCOPHAGE) 1000 MG tablet - A1C is quite elevated fron his last visit - we will add medication and continue his metformin.  We discussed when and how to check his BS so we can better decide on his medication.  This will hopefully help with his attention to his eating habits.  Continue current medications  until we get labs back - we will recheck in 3 months - he will continue his great lifestyle changes but focus more on his sugar and the response  Benny Lennert PA-C  [facility] 04/02/2015 9:17 AM

## 2015-03-28 NOTE — Patient Instructions (Signed)
3 months recheck

## 2015-03-29 ENCOUNTER — Telehealth: Payer: Self-pay

## 2015-03-29 NOTE — Telephone Encounter (Signed)
Sean Rogers called with a critical high sugar at 419. Called pt to check on him. LMOM to CB

## 2015-04-02 MED ORDER — CANAGLIFLOZIN 100 MG PO TABS: 100.0000 mg | ORAL_TABLET | Freq: Every day | ORAL | Status: DC

## 2015-04-02 NOTE — Telephone Encounter (Signed)
Pt called back that same day. He was feeling ok. Maralyn Sago already new about BS level. Pt will f/u as directed

## 2015-04-05 ENCOUNTER — Ambulatory Visit: Payer: BLUE CROSS/BLUE SHIELD | Admitting: *Deleted

## 2015-05-08 ENCOUNTER — Telehealth: Payer: Self-pay | Admitting: Family Medicine

## 2015-05-08 NOTE — Telephone Encounter (Signed)
lmom to call and reschedule his appt that he had with Maralyn Sago on 12/1 she will not be in the office that day

## 2015-06-27 ENCOUNTER — Ambulatory Visit: Payer: Self-pay | Admitting: Physician Assistant

## 2015-07-01 ENCOUNTER — Other Ambulatory Visit: Payer: Self-pay | Admitting: Physician Assistant

## 2015-08-05 ENCOUNTER — Ambulatory Visit (INDEPENDENT_AMBULATORY_CARE_PROVIDER_SITE_OTHER): Payer: 59 | Admitting: Family Medicine

## 2015-08-05 ENCOUNTER — Other Ambulatory Visit: Payer: Self-pay | Admitting: Physician Assistant

## 2015-08-05 VITALS — BP 132/84 | HR 75 | Temp 98.0°F | Resp 16 | Ht 70.0 in | Wt 277.8 lb

## 2015-08-05 DIAGNOSIS — IMO0001 Reserved for inherently not codable concepts without codable children: Secondary | ICD-10-CM

## 2015-08-05 DIAGNOSIS — R51 Headache: Secondary | ICD-10-CM

## 2015-08-05 DIAGNOSIS — E119 Type 2 diabetes mellitus without complications: Secondary | ICD-10-CM

## 2015-08-05 DIAGNOSIS — R519 Headache, unspecified: Secondary | ICD-10-CM

## 2015-08-05 DIAGNOSIS — E669 Obesity, unspecified: Secondary | ICD-10-CM | POA: Diagnosis not present

## 2015-08-05 DIAGNOSIS — E785 Hyperlipidemia, unspecified: Secondary | ICD-10-CM | POA: Diagnosis not present

## 2015-08-05 DIAGNOSIS — E1165 Type 2 diabetes mellitus with hyperglycemia: Secondary | ICD-10-CM | POA: Diagnosis not present

## 2015-08-05 DIAGNOSIS — I10 Essential (primary) hypertension: Secondary | ICD-10-CM | POA: Diagnosis not present

## 2015-08-05 LAB — COMPLETE METABOLIC PANEL WITH GFR
ALT: 14 U/L (ref 9–46)
AST: 14 U/L (ref 10–40)
Albumin: 4.2 g/dL (ref 3.6–5.1)
Alkaline Phosphatase: 73 U/L (ref 40–115)
BILIRUBIN TOTAL: 0.4 mg/dL (ref 0.2–1.2)
BUN: 15 mg/dL (ref 7–25)
CHLORIDE: 103 mmol/L (ref 98–110)
CO2: 30 mmol/L (ref 20–31)
CREATININE: 1 mg/dL (ref 0.60–1.35)
Calcium: 9.5 mg/dL (ref 8.6–10.3)
GFR, Est African American: >89 mL/min (ref >=60)
GFR, Est Non African American: >89 mL/min (ref >=60)
GLUCOSE: 131 mg/dL — AB (ref 65–99)
Potassium: 4.1 mmol/L (ref 3.5–5.3)
SODIUM: 141 mmol/L (ref 135–146)
TOTAL PROTEIN: 7.2 g/dL (ref 6.1–8.1)

## 2015-08-05 LAB — GLUCOSE, POCT (MANUAL RESULT ENTRY): POC Glucose: 138 mg/dl — AB (ref 70–99)

## 2015-08-05 LAB — LIPID PANEL
Cholesterol: 138 mg/dL (ref 125–200)
HDL: 42 mg/dL (ref >=40)
LDL CALC: 81 mg/dL (ref <130)
Total CHOL/HDL Ratio: 3.3 Ratio (ref <=5.0)
Triglycerides: 77 mg/dL (ref <150)
VLDL: 15 mg/dL (ref <30)

## 2015-08-05 LAB — POCT GLYCOSYLATED HEMOGLOBIN (HGB A1C): HEMOGLOBIN A1C: 6.9

## 2015-08-05 MED ORDER — CARVEDILOL 3.125 MG PO TABS: ORAL_TABLET | ORAL | Status: DC

## 2015-08-05 MED ORDER — CANAGLIFLOZIN 100 MG PO TABS: 100.0000 mg | ORAL_TABLET | Freq: Every day | ORAL | Status: DC

## 2015-08-05 MED ORDER — SIMVASTATIN 40 MG PO TABS: 40.0000 mg | ORAL_TABLET | Freq: Every day | ORAL | Status: DC

## 2015-08-05 MED ORDER — METFORMIN HCL 1000 MG PO TABS: ORAL_TABLET | ORAL | Status: DC

## 2015-08-05 MED ORDER — LISINOPRIL 20 MG PO TABS: ORAL_TABLET | ORAL | Status: DC

## 2015-08-05 NOTE — Patient Instructions (Signed)
Continue current medications  Get regular exercise  Work hard on decreasing your dietary intake, especially of starches and sweets (breads, pastas, potatoes, rice, sweets) and of fried and fatty food  Read the American diabetic Association website  Return in about 4 months for recheck

## 2015-08-05 NOTE — Progress Notes (Signed)
Patient ID: Sean Rogers, male    DOB: Jul 25, 1981  Age: 35 y.o. MRN: [id number]  Chief Complaint  Patient presents with  . Diabetic check  . Medication Refill    metformin, lisinopril, invokana, carvedilol, simvastatin  . Headache    Subjective:   35 year old man who has a history of diabetes. He is on medications for this including metformin and Invokana,, and needs to get them refilled. He is not strict with his diabetic diet. He does not get a lot of regular exercise. We talked about both legs. He does not smoke. He has not seen an eye doctor for about 4 years. He works for [employer]. Review of systems is essentially unremarkable HEENT, cardiovascular, respiratory, GI, GU, skeletal, neurologic, dermatologic.   Current allergies, medications, problem list, past/family and social histories reviewed.  Objective:  BP 132/84 mmHg  Pulse 75  Temp(Src) 98 F (36.7 C) (Oral)  Resp 16  Ht 5\' 10"  (1.778 m)  Wt 277 lb 12.8 oz (126.009 kg)  BMI 39.86 kg/m2  SpO2 98%  No major acute distress. Obese male. TMs normal. Eyes PERRLA. Fundi appear benign. Throat clear. Neck supple without nodes. Chest clear. Heart regular without murmurs. Himself without mass or tenderness. Did not examine his feet because he has outgrown boots on for the snow.   Results for orders placed or performed in visit on 08/05/15  POCT glucose (manual entry)  Result Value Ref Range   POC Glucose 138 (A) 70 - 99 mg/dl  POCT glycosylated hemoglobin (Hb A1C)  Result Value Ref Range   Hemoglobin A1C 6.9      Assessment & Plan:   Assessment: 1. Type 2 diabetes mellitus without complication, without long-term current use of insulin (HCC)   2. Nonintractable headache, unspecified chronicity pattern, unspecified headache type   3. Obesity   4. Essential hypertension   5. Hyperlipidemia   6. Uncontrolled type 2 diabetes mellitus without complication, without long-term current use of insulin (HCC)        Plan: Check labs  Orders Placed This Encounter  Procedures  . Microalbumin, urine  . COMPLETE METABOLIC PANEL WITH GFR  . Lipid panel  . POCT glucose (manual entry)  . POCT glycosylated hemoglobin (Hb A1C)    Meds ordered this encounter  Medications  . lisinopril (PRINIVIL,ZESTRIL) 20 MG tablet    Sig: TAKE ONE TABLET BY MOUTH ONE TIME DAILY.    Dispense:  90 tablet    Refill:  3  . carvedilol (COREG) 3.125 MG tablet    Sig: TAKE ONE TABLET BY MOUTH TWICE DAILY. TAKE WITH A MEAL.    Dispense:  180 tablet    Refill:  3  . simvastatin (ZOCOR) 40 MG tablet    Sig: Take 1 tablet (40 mg total) by mouth daily.    Dispense:  90 tablet    Refill:  3  . metFORMIN (GLUCOPHAGE) 1000 MG tablet    Sig: TAKE ONE TABLET BY MOUTH TWICE DAILY WITH MEALS.    Dispense:  180 tablet    Refill:  1  . canagliflozin (INVOKANA) 100 MG TABS tablet    Sig: Take 1 tablet (100 mg total) by mouth daily.    Dispense:  90 tablet    Refill:  1         Patient Instructions  Continue current medications  Get regular exercise  Work hard on decreasing your dietary intake, especially of starches and sweets (breads, pastas, potatoes, rice, sweets) and of fried  and fatty food  Read the American diabetic Association website  Return in about 4 months for recheck     Return in about 4 months (around 12/03/2015).   HOPPER,DAVID, MD 08/05/2015

## 2015-08-06 LAB — MICROALBUMIN, URINE: MICROALB UR: 0.6 mg/dL

## 2015-08-31 ENCOUNTER — Ambulatory Visit (INDEPENDENT_AMBULATORY_CARE_PROVIDER_SITE_OTHER): Payer: 59 | Admitting: Family Medicine

## 2015-08-31 VITALS — BP 140/88 | HR 88 | Temp 98.0°F | Resp 20 | Ht 69.5 in | Wt 277.1 lb

## 2015-08-31 DIAGNOSIS — J209 Acute bronchitis, unspecified: Secondary | ICD-10-CM

## 2015-08-31 MED ORDER — AZITHROMYCIN 250 MG PO TABS: ORAL_TABLET | ORAL | Status: DC

## 2015-08-31 MED ORDER — HYDROCODONE-HOMATROPINE 5-1.5 MG/5ML PO SYRP: 5.0000 mL | ORAL_SOLUTION | Freq: Three times a day (TID) | ORAL | Status: DC | PRN

## 2015-08-31 NOTE — Progress Notes (Signed)
By signing my name below, I, Rawaa Al Rifaie, attest that this documentation has been prepared under the direction and in the presence of Elvina Sidle, MD.  Watt Climes Rifaie, Medical Scribe. 08/31/2015.  1:51 PM.  Patient ID: Sean Rogers MRN: [id number], DOB: [birth year], 35 y.o. Date of Encounter: 08/31/2015  Primary Physician: Virgilio Belling  Chief Complaint:  Chief Complaint  Patient presents with  . Cough    with chest tightness  . Dizziness  . Generalized Body Aches    HPI:  Sean Rogers is a 35 y.o. male who presents to [facility] complaining of persistent cough with clear mucus.  Pt reports having associated symptoms of chest tightness, dizziness, and myalgia that has improved. He denies shortness of breath, swelling in the legs, or sick contact.    Past Medical History  Diagnosis Date  . Diabetes mellitus   . Obesity   . Hyperlipidemia   . Hypertension   . Nonischemic cardiomyopathy (HCC) February 2014    EF 35% by stress echo with no ischemic findings.-  Followup echo EF 35-40%     Home Meds: Prior to Admission medications   Medication Sig Start Date End Date Taking? Authorizing Provider  aspirin EC 81 MG EC tablet Take 1 tablet (81 mg total) by mouth daily. 09/01/13  Yes Belkys A Regalado, MD  canagliflozin (INVOKANA) 100 MG TABS tablet Take 1 tablet (100 mg total) by mouth daily. 08/05/15  Yes Peyton Najjar, MD  carvedilol (COREG) 3.125 MG tablet TAKE ONE TABLET BY MOUTH TWICE DAILY. TAKE WITH A MEAL. 08/05/15  Yes Peyton Najjar, MD  glucose blood test strip Use as instructed 01/08/15  Yes Morrell Riddle, PA-C  lisinopril (PRINIVIL,ZESTRIL) 20 MG tablet TAKE ONE TABLET BY MOUTH ONE TIME DAILY. 08/05/15  Yes Peyton Najjar, MD  metFORMIN (GLUCOPHAGE) 1000 MG tablet TAKE ONE TABLET BY MOUTH TWICE DAILY WITH MEALS. 08/05/15  Yes Peyton Najjar, MD  Multiple Vitamin (MULTIVITAMIN) tablet Take 1 tablet by mouth daily. Reported on 08/05/2015   Yes  Historical Provider, MD  simvastatin (ZOCOR) 40 MG tablet Take 1 tablet (40 mg total) by mouth daily. 08/05/15  Yes Peyton Najjar, MD    Allergies: No Known Allergies  Social History   Social History  . Marital Status: Married    Spouse Name: N/A  . Number of Children: N/A  . Years of Education: N/A   Occupational History  . Not on file.   Social History Main Topics  . Smoking status: Never Smoker   . Smokeless tobacco: Never Used  . Alcohol Use: No  . Drug Use: No  . Sexual Activity: Yes   Other Topics Concern  . Not on file   Social History Narrative   He is married. Does not drink and does not smoke alcohol.   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     Review of Systems: Constitutional: negative for chills, fever, night sweats, weight changes, or fatigue  HEENT: negative for vision changes, hearing loss, congestion, rhinorrhea, ST, epistaxis, or sinus pressure Cardiovascular: negative for chest pain or palpitations Respiratory: negative for hemoptysis, wheezing, shortness of breath. Positive for cough, and chest tightness. Abdominal: negative for abdominal pain, nausea, vomiting, diarrhea, or constipation Dermatological: negative for rash Neurologic: negative for headache, or syncope. Positive for dizziness. Msk: Positive for myalgia.  All other systems reviewed and are otherwise negative with the exception to those  above and in the HPI.  Physical Exam: Blood pressure 140/88, pulse 88, temperature 98 F (36.7 C), temperature source Oral, resp. rate 20, height 5' 9.5" (1.765 m), weight 277 lb 2 oz (125.703 kg), SpO2 98 %., Body mass index is 40.35 kg/(m^2). General: Well developed, well nourished, in no acute distress. Head: Normocephalic, atraumatic, eyes without discharge, sclera non-icteric, nares are without discharge. Bilateral auditory canals clear, TM's are without perforation, pearly grey and translucent with reflective cone of  light bilaterally. Oral cavity moist, posterior pharynx without exudate, erythema, peritonsillar abscess, or post nasal drip.  Neck: Supple. No thyromegaly. Full ROM. No lymphadenopathy. Lungs: Clear bilaterally to auscultation without wheezes, rales.   Few ronchi bilaterally.  Breathing is unlabored.  Heart: RRR with S1 S2. No murmurs, rubs, or gallops appreciated. Abdomen: Soft, non-tender, non-distended with normoactive bowel sounds. No hepatomegaly. No rebound/guarding. No obvious abdominal masses. Msk:  Strength and tone normal for age. Extremities/Skin: Warm and dry. No clubbing or cyanosis. No edema. No rashes or suspicious lesions. Neuro: Alert and oriented X 3. Moves all extremities spontaneously. Gait is normal. CNII-XII grossly in tact. Psych:  Responds to questions appropriately with a normal affect.    ASSESSMENT AND PLAN:  35 y.o. year old male with   This chart was scribed in my presence and reviewed by me personally.    ICD-9-CM ICD-10-CM   1. Acute bronchitis, unspecified organism 466.0 J20.9 azithromycin (ZITHROMAX) 250 MG tablet     HYDROcodone-homatropine (HYCODAN) 5-1.5 MG/5ML syrup     Signed, Elvina Sidle, MD  Signed, Elvina Sidle, MD 08/31/2015 1:48 PM

## 2015-08-31 NOTE — Patient Instructions (Signed)

## 2015-09-19 ENCOUNTER — Ambulatory Visit (INDEPENDENT_AMBULATORY_CARE_PROVIDER_SITE_OTHER): Payer: 59 | Admitting: Family Medicine

## 2015-09-19 VITALS — BP 126/70 | HR 91 | Temp 98.4°F | Resp 16 | Ht 69.5 in | Wt 287.0 lb

## 2015-09-19 DIAGNOSIS — J111 Influenza due to unidentified influenza virus with other respiratory manifestations: Secondary | ICD-10-CM | POA: Diagnosis not present

## 2015-09-19 DIAGNOSIS — R05 Cough: Secondary | ICD-10-CM

## 2015-09-19 DIAGNOSIS — R5383 Other fatigue: Secondary | ICD-10-CM

## 2015-09-19 MED ORDER — OSELTAMIVIR PHOSPHATE 75 MG PO CAPS: 75.0000 mg | ORAL_CAPSULE | Freq: Two times a day (BID) | ORAL | Status: DC

## 2015-09-19 NOTE — Patient Instructions (Signed)
Avian Influenza  Avian influenza is also known as bird flu. Avian influenza is a group of viruses that occurs naturally in wild and domestic birds. Avian influenza is easily spread (contagious) among birds and is deadly to them. Though rare, avian influenza can cause severe illness in humans. CAUSES Infected birds can spread avian influenza through:  Feces.  Nasal secretions.  Saliva. Birds become infected when they come into contact with infected birds or contaminated surfaces. The avian influenza virus is spread from country to country through the international poultry trade or by migrating birds. Avian influenza can then infect humans who:  Have contact with infected birds. The birds can be dead or alive.  Breathe in contaminated dust.  Touch contaminated surfaces. It is rare for there to be human-to-human transmission of avian influenza. However, influenza viruses can change, so human-to-human transmission may become more likely in the future. RISK FACTORS The main risk factor for avian influenza is having exposure to birds or poultry. SIGNS AND SYMPTOMS  Fever.  Cough.  Sore throat.  Nausea and vomiting.  Diarrhea.  Muscle aches.  Tiredness (fatigue).  Inflammation or redness of the eyes (conjunctivitis).  Shortness of breath.  Difficulty breathing.  Abdominal pain.  Seizures.  Mental confusion. DIAGNOSIS Diagnosis may include:  Medical history and physical exam.  Chest X-ray.  Examination of a fluid sample from your throat or nose.  Blood tests. TREATMENT Treatment for avian influenza may include:  Antiviral medicines.  Supportive care to relieve your symptoms. HOME CARE INSTRUCTIONS  Take medicines only as directed by your health care provider.  Use a cool mist humidifier. This makes breathing easier.  Rest as directed by your health care provider.  Drink enough fluid to keep your urine clear or pale yellow.  Cover your mouth and nose  when coughing or sneezing.  Wash your hands well to prevent the virus from spreading.  Avoid crowded areas. Stay home from work or school until directed by your health care provider. PREVENTION  Stay home from work and school when you are sick. Not being in contact with other people will help stop the spread of illness.  Cover your mouth and nose with your arm when coughing or sneezing. This may help keep those around you from getting sick.  Wash your hands often with warm water and soap. Illnesses are often spread when a person touches something that is contaminated with germs and then touches his or her eyes, nose, or mouth.  If you think you have been exposed to avian influenza, ask your health care provider about preventative antiviral medicines. These can help prevent infection from occurring. SEEK MEDICAL CARE IF:  You have a fever.  You have new symptoms.  Your symptoms get worse.  Your symptoms do not get better with treatment. SEEK IMMEDIATE MEDICAL CARE IF:  Your skin or nails turn bluish.  You have a skin rash.  You are urinating noticeably less or not at all.  You have chest pain.  You have trouble breathing.  You feel like your heart is fluttering, skipping a beat, or beating faster than normal.  You have confusion.  You have chills, weakness, or light-headedness.  You develop a sudden headache, or pain in your face or ear.  You have severe pain or stiffness in your neck.  You cough up blood.  You have nausea and vomiting that you cannot control.    This information is not intended to replace advice given to you by your health   care provider. Make sure you discuss any questions you have with your health care provider.   Document Released: 07/16/2003 Document Revised: 08/03/2014 Document Reviewed: 02/13/2014 Elsevier Interactive Patient Education 2016 Elsevier Inc.  

## 2015-09-19 NOTE — Progress Notes (Signed)
By signing my name below, I, Stann Ore, attest that this documentation has been prepared under the direction and in the presence of Elvina Sidle, MD. Electronically Signed: Stann Ore, Scribe. 09/19/2015 , 3:33 PM .  Patient was seen in room 11 .   Patient ID: Sean Rogers MRN: [id number], DOB: 03-Apr-1981, 35 y.o. Date of Encounter: 09/19/2015  Primary Physician: Virgilio Belling  Chief Complaint:  Chief Complaint  Patient presents with  . Cough    for a couple of days  . Sore Throat    HPI:  Sean Rogers is a 35 y.o. male who presents to [facility] complaining of cough with sore throat for the past few days. He also reports being very fatigue and having rhinorrhea last night. He received a flu shot this year.   He works at [employer]   Past Medical History  Diagnosis Date  . Diabetes mellitus   . Obesity   . Hyperlipidemia   . Hypertension   . Nonischemic cardiomyopathy (HCC) February 2014    EF 35% by stress echo with no ischemic findings.-  Followup echo EF 35-40%     Home Meds: Prior to Admission medications   Medication Sig Start Date End Date Taking? Authorizing Provider  aspirin EC 81 MG EC tablet Take 1 tablet (81 mg total) by mouth daily. 09/01/13  Yes Belkys A Regalado, MD  azithromycin (ZITHROMAX) 250 MG tablet Take 2 tabs PO x 1 dose, then 1 tab PO QD x 4 days 08/31/15  Yes Elvina Sidle, MD  canagliflozin (INVOKANA) 100 MG TABS tablet Take 1 tablet (100 mg total) by mouth daily. 08/05/15  Yes Peyton Najjar, MD  carvedilol (COREG) 3.125 MG tablet TAKE ONE TABLET BY MOUTH TWICE DAILY. TAKE WITH A MEAL. 08/05/15  Yes Peyton Najjar, MD  glucose blood test strip Use as instructed 01/08/15  Yes Morrell Riddle, PA-C  HYDROcodone-homatropine [facility]) 5-1.5 MG/5ML syrup Take 5 mLs by mouth every 8 (eight) hours as needed for cough. 08/31/15  Yes Elvina Sidle, MD  lisinopril (PRINIVIL,ZESTRIL) 20 MG tablet TAKE ONE TABLET BY MOUTH  ONE TIME DAILY. 08/05/15  Yes Peyton Najjar, MD  metFORMIN (GLUCOPHAGE) 1000 MG tablet TAKE ONE TABLET BY MOUTH TWICE DAILY WITH MEALS. 08/05/15  Yes Peyton Najjar, MD  Multiple Vitamin (MULTIVITAMIN) tablet Take 1 tablet by mouth daily. Reported on 08/05/2015   Yes Historical Provider, MD  simvastatin (ZOCOR) 40 MG tablet Take 1 tablet (40 mg total) by mouth daily. 08/05/15   Peyton Najjar, MD    Allergies: No Known Allergies  Social History   Social History  . Marital Status: Married    Spouse Name: N/A  . Number of Children: N/A  . Years of Education: N/A   Occupational History  . Not on file.   Social History Main Topics  . Smoking status: Never Smoker   . Smokeless tobacco: Never Used  . Alcohol Use: No  . Drug Use: No  . Sexual Activity: Yes   Other Topics Concern  . Not on file   Social History Narrative   He is married. Does not drink and does not smoke alcohol.   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     Review of Systems: Constitutional: negative for fever, chills, night sweats, weight changes; positive for fatigue HEENT: negative for vision changes, hearing loss, congestion, epistaxis, or sinus pressure; positive for rhinorrhea and sore  throat Cardiovascular: negative for chest pain or palpitations Respiratory: negative for hemoptysis, wheezing, shortness of breath; positive for cough Abdominal: negative for abdominal pain, nausea, vomiting, diarrhea, or constipation Dermatological: negative for rash Neurologic: negative for headache, dizziness, or syncope All other systems reviewed and are otherwise negative with the exception to those above and in the HPI.  Physical Exam: Blood pressure 126/70, pulse 91, temperature 98.4 F (36.9 C), resp. rate 16, height 5' 9.5" (1.765 m), weight 287 lb (130.182 kg), SpO2 98 %., Body mass index is 41.79 kg/(m^2). General: Well developed, well nourished, in no acute distress; appears acutely  ill Head: Normocephalic, atraumatic, eyes without discharge, sclera non-icteric, nares are without discharge. Bilateral auditory canals clear, TM's are without perforation, pearly grey and translucent with reflective cone of light bilaterally. Oral cavity moist, posterior pharynx without exudate, erythema, peritonsillar abscess, or post nasal drip.  Neck: Supple. No thyromegaly. Full ROM. No lymphadenopathy. Lungs: Clear bilaterally to auscultation without wheezes, rales, or rhonchi. Breathing is unlabored. Heart: RRR with S1 S2. No murmurs, rubs, or gallops appreciated. Msk:  Strength and tone normal for age. Extremities/Skin: Warm and dry. No clubbing or cyanosis. No edema. No rashes or suspicious lesions. Neuro: Alert and oriented X 3. Moves all extremities spontaneously. Gait is normal. CNII-XII grossly in tact. Psych:  Responds to questions appropriately with a normal affect.    ASSESSMENT AND PLAN:  35 y.o. year old male with  This chart was scribed in my presence and reviewed by me personally.    ICD-9-CM ICD-10-CM   1. Influenza with respiratory manifestation 487.1 J11.1 oseltamivir (TAMIFLU) 75 MG capsule    Signed, Elvina Sidle, MD 09/19/2015 3:33 PM

## 2015-09-24 ENCOUNTER — Telehealth: Payer: Self-pay

## 2015-09-24 NOTE — Telephone Encounter (Signed)
Pt states he was diagnosed with the Flu and given an oow note, went to work today and still didn't feel well, would like to get the note amended for today as well. Please call [phone] And he will come pick up

## 2015-09-24 NOTE — Telephone Encounter (Signed)
Letter written. Pt notified. 

## 2015-09-24 NOTE — Telephone Encounter (Signed)
Pt called to inq. If he could pick up note.. Advised no action taken on this message but he is welcome to come by and discuss matter with clinical team member as a phone operator is unable to tell him if the amendment will be approved or not.

## 2016-02-02 ENCOUNTER — Other Ambulatory Visit: Payer: Self-pay | Admitting: Family Medicine

## 2016-03-04 ENCOUNTER — Ambulatory Visit (INDEPENDENT_AMBULATORY_CARE_PROVIDER_SITE_OTHER): Payer: 59 | Admitting: Physician Assistant

## 2016-03-04 VITALS — BP 130/80 | HR 70 | Temp 98.2°F | Ht 69.5 in | Wt 283.0 lb

## 2016-03-04 DIAGNOSIS — I10 Essential (primary) hypertension: Secondary | ICD-10-CM

## 2016-03-04 DIAGNOSIS — E785 Hyperlipidemia, unspecified: Secondary | ICD-10-CM | POA: Diagnosis not present

## 2016-03-04 DIAGNOSIS — R05 Cough: Secondary | ICD-10-CM

## 2016-03-04 DIAGNOSIS — E119 Type 2 diabetes mellitus without complications: Secondary | ICD-10-CM

## 2016-03-04 DIAGNOSIS — Z114 Encounter for screening for human immunodeficiency virus [HIV]: Secondary | ICD-10-CM | POA: Diagnosis not present

## 2016-03-04 MED ORDER — CANAGLIFLOZIN 100 MG PO TABS: ORAL_TABLET | ORAL | 0 refills | Status: DC

## 2016-03-04 MED ORDER — GLUCOSE BLOOD VI STRP: ORAL_STRIP | 12 refills | Status: DC

## 2016-03-04 MED ORDER — SIMVASTATIN 40 MG PO TABS: 40.0000 mg | ORAL_TABLET | Freq: Every day | ORAL | 3 refills | Status: DC

## 2016-03-04 MED ORDER — LISINOPRIL 20 MG PO TABS: ORAL_TABLET | ORAL | 3 refills | Status: DC

## 2016-03-04 MED ORDER — METFORMIN HCL 1000 MG PO TABS: 1000.0000 mg | ORAL_TABLET | Freq: Two times a day (BID) | ORAL | 0 refills | Status: DC

## 2016-03-04 MED ORDER — GLUCOSE BLOOD VI STRP: ORAL_STRIP | 12 refills | Status: AC

## 2016-03-04 MED ORDER — CARVEDILOL 3.125 MG PO TABS: ORAL_TABLET | ORAL | 3 refills | Status: DC

## 2016-03-04 NOTE — Addendum Note (Signed)
Addended by: Thelma Barge D on: 03/04/2016 05:19 PM   Modules accepted: Orders

## 2016-03-04 NOTE — Patient Instructions (Addendum)
  Mucinex for congestion and continue on your allergy medications.  Continue to try and eat healthy  Make an appt for that toenail removal  Make an eye MD appt and get them to send me the results   IF you received an x-ray today, you will receive an invoice from [facility]. Please contact [facility] at [phone] with questions or concerns regarding your invoice.   IF you received labwork today, you will receive an invoice from [facility]. Please contact [facility] at [phone] with questions or concerns regarding your invoice.   Our billing staff will not be able to assist you with questions regarding bills from these companies.  You will be contacted with the lab results as soon as they are available. The fastest way to get your results is to activate your My Chart account. Instructions are located on the last page of this paperwork. If you have not heard from [place] regarding the results in 2 weeks, please contact this office.

## 2016-03-04 NOTE — Progress Notes (Signed)
Sean Rogers  MRN: [id number] DOB: [birth year]  Subjective:  Pt presents to clinic for DM check and cough 1- DM - glucose at home average 130s fasting - 150s 2 hr post-prandial - no feet paresthesias - no CP or SOB - feel good Last eye exam - he is unsure 2- cough - has had for about 1-2 weeks and then about a week ago he noticed that the sputum was yellow/green - this was started to improve - no SOB - worse at night but not keeping him up  Review of Systems  Constitutional: Negative for chills and fever.  HENT: Positive for congestion (improving) and postnasal drip.   Respiratory: Positive for cough. Negative for shortness of breath.   Cardiovascular: Negative for chest pain, palpitations and leg swelling.  Allergic/Immunologic: Positive for environmental allergies.  Neurological: Negative for numbness.    Patient Active Problem List   Diagnosis Date Noted  . Headache(784.0) 08/31/2013  . Chest pain with moderate risk for cardiac etiology 08/30/2013  . Dyspnea on exertion 08/30/2013  . Nonischemic cardiomyopathy - lock to be viral; EF 35-40% 08/30/2013    Class: Diagnosis of  . Chest pain 08/29/2013  . Chest tightness or pressure -with exertion 08/29/2013  . Type 2 diabetes mellitus, uncontrolled (HCC) 08/05/2011  . Obesity, Class II, BMI 35-39.9 08/05/2011  . Hypertension 08/05/2011  . Hyperlipidemia 08/05/2011    Current Outpatient Prescriptions on File Prior to Visit  Medication Sig Dispense Refill  . aspirin EC 81 MG EC tablet Take 1 tablet (81 mg total) by mouth daily. 30 tablet 0  . Multiple Vitamin (MULTIVITAMIN) tablet Take 1 tablet by mouth daily. Reported on 08/05/2015     No current facility-administered medications on file prior to visit.     No Known Allergies  Pt patients past, family and social history were reviewed and updated.  Objective:  BP 130/80 (BP Location: Right Arm, Patient Position: Sitting, Cuff Size: Large)   Pulse 70   Temp 98.2 F (36.8  C) (Oral)   Ht 5' 9.5" (1.765 m)   Wt 283 lb (128.4 kg)   SpO2 97%   BMI 41.19 kg/m   Physical Exam  Constitutional: He is oriented to person, place, and time and well-developed, well-nourished, and in no distress.  HENT:  Head: Normocephalic and atraumatic.  Right Ear: Hearing, tympanic membrane, external ear and ear canal normal.  Left Ear: Hearing, tympanic membrane, external ear and ear canal normal.  Nose: Mucosal edema (pale) present.  Mouth/Throat: Uvula is midline, oropharynx is clear and moist and mucous membranes are normal.  Eyes: Conjunctivae are normal.  Neck: Normal range of motion.  Cardiovascular: Normal rate, regular rhythm, normal heart sounds and intact distal pulses.   Pulmonary/Chest: Effort normal and breath sounds normal. He has no wheezes.  Musculoskeletal:       Right lower leg: He exhibits no edema.       Left lower leg: He exhibits no edema.  Lymphadenopathy:       Head (right side): No tonsillar adenopathy present.       Head (left side): No tonsillar adenopathy present.    He has no cervical adenopathy.       Right: No supraclavicular adenopathy present.       Left: No supraclavicular adenopathy present.  Neurological: He is alert and oriented to person, place, and time. Gait normal.  Skin: Skin is warm and dry.  Diabetic Foot Exam - Simple   Simple Foot Form Diabetic  Foot exam was performed with the following findings:  Yes  03/04/2016  4:50 PM  Visual Inspection No deformities, no ulcerations, no other skin breakdown bilaterally:  Yes Sensation Testing Intact to touch and monofilament testing bilaterally:  Yes Pulse Check Posterior Tibialis and Dorsalis pulse intact bilaterally:  Yes Comments Left 2nd onycholytic toenail     Psychiatric: Mood, memory, affect and judgment normal.    Assessment and Plan :  Encounter for screening for HIV - Plan: HIV antibody  Essential hypertension - Plan: carvedilol (COREG) 3.125 MG tablet, lisinopril  (PRINIVIL,ZESTRIL) 20 MG tablet  Controlled type 2 diabetes mellitus without complication, without long-term current use of insulin (HCC) - Plan: COMPLETE METABOLIC PANEL WITH GFR, Hemoglobin A1c, canagliflozin (INVOKANA) 100 MG TABS tablet, glucose blood test strip, metFORMIN (GLUCOPHAGE) 1000 MG tablet, HM DIABETES FOOT EXAM - discussed risk of Invokana pt will monitor but he has good control and he would like to continue on it for now  Hyperlipidemia - Plan: Lipid panel, simvastatin (ZOCOR) 40 MG tablet  Pt will make an appt for eye exam and for left toenail removal.  He will continue a healthy diet and try and exercise.  Benny Lennert PA-C  [facility] 03/04/2016 4:52 PM

## 2016-03-05 LAB — COMPLETE METABOLIC PANEL WITH GFR
ALBUMIN: 4 g/dL (ref 3.6–5.1)
ALK PHOS: 105 U/L (ref 40–115)
ALT: 15 U/L (ref 9–46)
AST: 16 U/L (ref 10–40)
BUN: 14 mg/dL (ref 7–25)
CALCIUM: 9.7 mg/dL (ref 8.6–10.3)
CHLORIDE: 104 mmol/L (ref 98–110)
CO2: 26 mmol/L (ref 20–31)
Creat: 0.98 mg/dL (ref 0.60–1.35)
GFR, Est African American: >89 mL/min (ref >=60)
Glucose, Bld: 112 mg/dL — ABNORMAL HIGH (ref 65–99)
POTASSIUM: 4.1 mmol/L (ref 3.5–5.3)
Sodium: 138 mmol/L (ref 135–146)
Total Bilirubin: 0.5 mg/dL (ref 0.2–1.2)
Total Protein: 7.4 g/dL (ref 6.1–8.1)

## 2016-03-05 LAB — HIV ANTIBODY (ROUTINE TESTING W REFLEX): HIV: NONREACTIVE

## 2016-03-05 LAB — LIPID PANEL
CHOL/HDL RATIO: 3.2 ratio (ref <=5.0)
CHOLESTEROL: 152 mg/dL (ref 125–200)
HDL: 47 mg/dL (ref >=40)
LDL Cholesterol: 89 mg/dL (ref <130)
TRIGLYCERIDES: 81 mg/dL (ref <150)
VLDL: 16 mg/dL (ref <30)

## 2016-03-06 LAB — HEMOGLOBIN A1C
Hgb A1c MFr Bld: 6.9 % — ABNORMAL HIGH (ref <5.7)
Mean Plasma Glucose: 151 mg/dL

## 2016-03-08 ENCOUNTER — Other Ambulatory Visit: Payer: Self-pay | Admitting: Family Medicine

## 2016-03-27 ENCOUNTER — Ambulatory Visit (INDEPENDENT_AMBULATORY_CARE_PROVIDER_SITE_OTHER): Payer: 59 | Admitting: Physician Assistant

## 2016-03-27 VITALS — BP 122/74 | HR 79 | Temp 98.4°F | Resp 17 | Ht 70.5 in | Wt 278.0 lb

## 2016-03-27 DIAGNOSIS — B351 Tinea unguium: Secondary | ICD-10-CM

## 2016-03-27 DIAGNOSIS — L601 Onycholysis: Secondary | ICD-10-CM

## 2016-03-27 NOTE — Progress Notes (Signed)
   Sean Rogers  MRN: [id number] DOB: [birth year]  Subjective:  Pt presents to clinic for toenail removal.  He has learned to deal with it but it does rub on his shoe.  He has done nothing for it at home.  Review of Systems  Constitutional: Negative for chills and fever.    Patient Active Problem List   Diagnosis Date Noted  . Headache(784.0) 08/31/2013  . Chest pain with moderate risk for cardiac etiology 08/30/2013  . Dyspnea on exertion 08/30/2013  . Nonischemic cardiomyopathy - lock to be viral; EF 35-40% 08/30/2013    Class: Diagnosis of  . Chest pain 08/29/2013  . Chest tightness or pressure -with exertion 08/29/2013  . Type 2 diabetes mellitus, uncontrolled (HCC) 08/05/2011  . Obesity, Class II, BMI 35-39.9 08/05/2011  . Hypertension 08/05/2011  . Hyperlipidemia 08/05/2011    Current Outpatient Prescriptions on File Prior to Visit  Medication Sig Dispense Refill  . aspirin EC 81 MG EC tablet Take 1 tablet (81 mg total) by mouth daily. 30 tablet 0  . canagliflozin (INVOKANA) 100 MG TABS tablet TAKE 1 TABLET (100 MG TOTAL) BY MOUTH DAILY. 90 tablet 0  . carvedilol (COREG) 3.125 MG tablet TAKE ONE TABLET BY MOUTH TWICE DAILY. TAKE WITH A MEAL. 180 tablet 3  . glucose blood test strip Use as directed once a day.  DX: E11.65 50 each 12  . lisinopril (PRINIVIL,ZESTRIL) 20 MG tablet TAKE ONE TABLET BY MOUTH ONE TIME DAILY. 90 tablet 3  . metFORMIN (GLUCOPHAGE) 1000 MG tablet Take 1 tablet (1,000 mg total) by mouth 2 (two) times daily with a meal. 180 tablet 0  . Multiple Vitamin (MULTIVITAMIN) tablet Take 1 tablet by mouth daily. Reported on 08/05/2015    . simvastatin (ZOCOR) 40 MG tablet Take 1 tablet (40 mg total) by mouth daily. 90 tablet 3   No current facility-administered medications on file prior to visit.     No Known Allergies  Pt patients past, family and social history were reviewed and updated.  Objective:  BP 122/74 (BP Location: Right Arm, Patient Position:  Sitting, Cuff Size: Normal)   Pulse 79   Temp 98.4 F (36.9 C) (Oral)   Resp 17   Ht 5' 10.5" (1.791 m)   Wt 278 lb (126.1 kg)   SpO2 98%   BMI 39.33 kg/m   Physical Exam  Constitutional: He is oriented to person, place, and time and well-developed, well-nourished, and in no distress.  HENT:  Head: Normocephalic and atraumatic.  Right Ear: External ear normal.  Left Ear: External ear normal.  Eyes: Conjunctivae are normal.  Neck: Normal range of motion.  Pulmonary/Chest: Effort normal.  Neurological: He is alert and oriented to person, place, and time. Gait normal.  Skin: Skin is warm and dry.  Left 2nd toenail - onycholysis due to onychomycosis - no erythema or swelling of the nail edge  Psychiatric: Mood, memory, affect and judgment normal.   Procedure:  Consent obtained.  Digital block with 2% lidocaine.  Betadine swab.  Nail plate removed.  Xeroform gauze placed on toenail.  Drsg placed Assessment and Plan :  Onycholysis of toenail  Onychomycosis   Nail plate removed -- wound care d/w pt.  Benny Lennert PA-C  [facility] 03/27/2016 11:14 AM

## 2016-03-27 NOTE — Patient Instructions (Addendum)
  INGROWN TOENAIL . Keep area clean, dry and bandaged for 24 hours. . After 24 hours, remove outer bandage and leave yellow gauze in place. . Soak toe/foot in warm soapy water for 5-10 minutes, once daily for 5 days. Rebandage toe after each cleaning. . Continue soaks until yellow gauze falls off. . Notify the office if you experience any of the following signs of infection: Swelling, redness, pus drainage, streaking, fever > 101.0 F     IF you received an x-ray today, you will receive an invoice from [facility]. Please contact [facility] at [phone] with questions or concerns regarding your invoice.   IF you received labwork today, you will receive an invoice from [facility]. Please contact [facility] at [phone] with questions or concerns regarding your invoice.   Our billing staff will not be able to assist you with questions regarding bills from these companies.  You will be contacted with the lab results as soon as they are available. The fastest way to get your results is to activate your My Chart account. Instructions are located on the last page of this paperwork. If you have not heard from us regarding the results in 2 weeks, please contact this office.      

## 2016-04-02 ENCOUNTER — Other Ambulatory Visit: Payer: Self-pay | Admitting: Physician Assistant

## 2016-04-02 DIAGNOSIS — E119 Type 2 diabetes mellitus without complications: Secondary | ICD-10-CM

## 2016-04-08 MED ORDER — CANAGLIFLOZIN 100 MG PO TABS: ORAL_TABLET | ORAL | 0 refills | Status: DC

## 2016-05-14 ENCOUNTER — Other Ambulatory Visit: Payer: Self-pay | Admitting: Physician Assistant

## 2016-05-14 DIAGNOSIS — E119 Type 2 diabetes mellitus without complications: Secondary | ICD-10-CM

## 2016-05-15 MED ORDER — CANAGLIFLOZIN 100 MG PO TABS: ORAL_TABLET | ORAL | 0 refills | Status: DC

## 2016-05-15 NOTE — Telephone Encounter (Signed)
Sent in a 90 day supply and notified pt.

## 2016-06-05 ENCOUNTER — Other Ambulatory Visit: Payer: Self-pay | Admitting: Physician Assistant

## 2016-06-05 DIAGNOSIS — E119 Type 2 diabetes mellitus without complications: Secondary | ICD-10-CM

## 2016-08-16 ENCOUNTER — Other Ambulatory Visit: Payer: Self-pay | Admitting: Physician Assistant

## 2016-08-16 DIAGNOSIS — E119 Type 2 diabetes mellitus without complications: Secondary | ICD-10-CM

## 2016-08-16 NOTE — Telephone Encounter (Signed)
02/2016 last ov needs ov

## 2016-08-26 ENCOUNTER — Other Ambulatory Visit: Payer: Self-pay

## 2016-08-26 DIAGNOSIS — E119 Type 2 diabetes mellitus without complications: Secondary | ICD-10-CM

## 2016-08-26 DIAGNOSIS — I10 Essential (primary) hypertension: Secondary | ICD-10-CM

## 2016-08-26 MED ORDER — LISINOPRIL 20 MG PO TABS: ORAL_TABLET | ORAL | 1 refills | Status: DC

## 2016-08-26 MED ORDER — SIMVASTATIN 40 MG PO TABS: 40.0000 mg | ORAL_TABLET | Freq: Every day | ORAL | 1 refills | Status: DC

## 2016-08-26 MED ORDER — CARVEDILOL 3.125 MG PO TABS: ORAL_TABLET | ORAL | 1 refills | Status: DC

## 2016-08-26 MED ORDER — METFORMIN HCL 1000 MG PO TABS
1000.0000 mg | ORAL_TABLET | Freq: Two times a day (BID) | ORAL | 0 refills | Status: DC
Start: 2016-08-26 — End: 2016-10-21

## 2016-08-26 NOTE — Telephone Encounter (Signed)
Fax req Simvastatin, Carvedilol, Lisinopril, Metformin to [facility] rx  Pt due for appt for Metformin - sent 30 days Simvastatin, Carvedilol, lisinopril - sent #90 , RF x 1

## 2016-09-16 ENCOUNTER — Other Ambulatory Visit: Payer: Self-pay | Admitting: Physician Assistant

## 2016-09-16 DIAGNOSIS — E119 Type 2 diabetes mellitus without complications: Secondary | ICD-10-CM

## 2016-10-18 ENCOUNTER — Other Ambulatory Visit: Payer: Self-pay | Admitting: Physician Assistant

## 2016-10-18 DIAGNOSIS — E119 Type 2 diabetes mellitus without complications: Secondary | ICD-10-CM

## 2016-10-19 NOTE — Telephone Encounter (Signed)
Left message to schedule visit before this supply is exhausted.  Meds ordered this encounter  Medications  . canagliflozin (INVOKANA) 100 MG TABS tablet    Sig: Take 1 tablet (100 mg total) by mouth daily.    Dispense:  30 tablet    Refill:  0    Please notify patient that s/he needs an office visit +/- labsfor additional refills. SECOND NOTICE.

## 2016-10-21 ENCOUNTER — Ambulatory Visit (INDEPENDENT_AMBULATORY_CARE_PROVIDER_SITE_OTHER): Payer: 59 | Admitting: Physician Assistant

## 2016-10-21 VITALS — BP 130/84 | HR 83 | Temp 97.3°F | Resp 18 | Ht 70.5 in | Wt 276.0 lb

## 2016-10-21 DIAGNOSIS — E669 Obesity, unspecified: Secondary | ICD-10-CM

## 2016-10-21 DIAGNOSIS — I10 Essential (primary) hypertension: Secondary | ICD-10-CM

## 2016-10-21 DIAGNOSIS — E7800 Pure hypercholesterolemia, unspecified: Secondary | ICD-10-CM

## 2016-10-21 DIAGNOSIS — E119 Type 2 diabetes mellitus without complications: Secondary | ICD-10-CM | POA: Diagnosis not present

## 2016-10-21 DIAGNOSIS — Z23 Encounter for immunization: Secondary | ICD-10-CM

## 2016-10-21 MED ORDER — METFORMIN HCL 1000 MG PO TABS: 1000.0000 mg | ORAL_TABLET | Freq: Two times a day (BID) | ORAL | 0 refills | Status: DC

## 2016-10-21 MED ORDER — CANAGLIFLOZIN 100 MG PO TABS: 100.0000 mg | ORAL_TABLET | Freq: Every day | ORAL | 0 refills | Status: DC

## 2016-10-21 NOTE — Patient Instructions (Signed)
     IF you received an x-ray today, you will receive an invoice from [facility]. Please contact  Radiology at [phone] with questions or concerns regarding your invoice.   IF you received labwork today, you will receive an invoice from [facility]. Please contact [facility] at [phone] with questions or concerns regarding your invoice.   Our billing staff will not be able to assist you with questions regarding bills from these companies.  You will be contacted with the lab results as soon as they are available. The fastest way to get your results is to activate your My Chart account. Instructions are located on the last page of this paperwork. If you have not heard from us regarding the results in 2 weeks, please contact this office.     

## 2016-10-21 NOTE — Progress Notes (Signed)
Sean Rogers  MRN: [id number] DOB: [birth year]  PCP: Virgilio Belling  Chief Complaint  Patient presents with  . Follow-up    Diabetes check    Subjective:  Pt presents to clinic for DM check.  He has been doing well.  No medication problems.  Feels good.  Home glucose - 129 - fasting  He has not had his BP medications yet this am  Review of Systems  Constitutional: Negative for chills and fever.  Respiratory: Negative for cough and shortness of breath.   Cardiovascular: Negative for chest pain, palpitations and leg swelling.  Neurological: Negative for numbness.    Patient Active Problem List   Diagnosis Date Noted  . Headache(784.0) 08/31/2013  . Chest pain with moderate risk for cardiac etiology 08/30/2013  . Dyspnea on exertion 08/30/2013  . Nonischemic cardiomyopathy - lock to be viral; EF 35-40% 08/30/2013    Class: Diagnosis of  . Chest pain 08/29/2013  . Chest tightness or pressure -with exertion 08/29/2013  . Controlled type 2 diabetes mellitus without complication, without long-term current use of insulin (HCC) 08/05/2011  . Obesity, Class II, BMI 35-39.9 08/05/2011  . Hypertension 08/05/2011  . Hyperlipidemia 08/05/2011    Current Outpatient Prescriptions on File Prior to Visit  Medication Sig Dispense Refill  . aspirin EC 81 MG EC tablet Take 1 tablet (81 mg total) by mouth daily. 30 tablet 0  . carvedilol (COREG) 3.125 MG tablet TAKE ONE TABLET BY MOUTH TWICE DAILY. TAKE WITH A MEAL. 180 tablet 1  . glucose blood test strip Use as directed once a day.  DX: E11.65 50 each 12  . lisinopril (PRINIVIL,ZESTRIL) 20 MG tablet TAKE ONE TABLET BY MOUTH ONE TIME DAILY. 90 tablet 1  . simvastatin (ZOCOR) 40 MG tablet Take 1 tablet (40 mg total) by mouth daily. 90 tablet 1   No current facility-administered medications on file prior to visit.     No Known Allergies  Pt patients past, family and social history were reviewed and updated.   Objective:  BP  130/84   Pulse 83   Temp 97.3 F (36.3 C) (Oral)   Resp 18   Ht 5' 10.5" (1.791 m)   Wt 276 lb (125.2 kg)   SpO2 99%   BMI 39.04 kg/m   Physical Exam  Constitutional: He is oriented to person, place, and time and well-developed, well-nourished, and in no distress.  HENT:  Head: Normocephalic and atraumatic.  Right Ear: External ear normal.  Left Ear: External ear normal.  Eyes: Conjunctivae are normal.  Neck: Normal range of motion.  Cardiovascular: Normal rate, regular rhythm, normal heart sounds and intact distal pulses.   Pulmonary/Chest: Effort normal and breath sounds normal. He has no wheezes.  Musculoskeletal:       Right lower leg: He exhibits no edema.       Left lower leg: He exhibits no edema.  Neurological: He is alert and oriented to person, place, and time. Gait normal.  Skin: Skin is warm and dry.  Psychiatric: Mood, memory, affect and judgment normal.    Assessment and Plan :   Problem List Items Addressed This Visit      Cardiovascular and Mediastinum   Hypertension (Chronic)    Well controlled. Continue current medications.  Pt does not need RF today.        Endocrine   Controlled type 2 diabetes mellitus without complication, without long-term current use of insulin (HCC) - Primary    Refilled medications.  Check labs today adjust medications as necessary. Continue home glucose check.  Continue healthyl lifestyle choices.      Relevant Medications   canagliflozin (INVOKANA) 100 MG TABS tablet   metFORMIN (GLUCOPHAGE) 1000 MG tablet   Other Relevant Orders   Hemoglobin A1c     Other   Obesity, Class II, BMI 35-39.9    Encouraged healthy lifestyle choices.      Relevant Medications   canagliflozin (INVOKANA) 100 MG TABS tablet   metFORMIN (GLUCOPHAGE) 1000 MG tablet   Hyperlipidemia (Chronic)    Check labs today - adjust medications as needed.  On Statin      Relevant Orders   Lipid panel    Other Visit Diagnoses    Need for  23-polyvalent pneumococcal polysaccharide vaccine       Relevant Orders   Pneumococcal polysaccharide vaccine 23-valent greater than or equal to 2yo subcutaneous/IM (Completed)        Benny Lennert PA-C  [facility] at [facility] 10/21/2016 11:56 AM

## 2016-10-21 NOTE — Assessment & Plan Note (Signed)
Refilled medications.  Check labs today adjust medications as necessary. Continue home glucose check.  Continue healthyl lifestyle choices.

## 2016-10-21 NOTE — Assessment & Plan Note (Signed)
Well controlled. Continue current medications.  Pt does not need RF today.

## 2016-10-21 NOTE — Assessment & Plan Note (Signed)
Check labs today - adjust medications as needed.  On Statin

## 2016-10-21 NOTE — Assessment & Plan Note (Signed)
Encouraged healthy lifestyle choices.  

## 2016-10-22 LAB — LIPID PANEL
CHOL/HDL RATIO: 3.1 ratio (ref 0.0–5.0)
Cholesterol, Total: 138 mg/dL (ref 100–199)
HDL: 45 mg/dL (ref >39)
LDL Calculated: 82 mg/dL (ref 0–99)
Triglycerides: 53 mg/dL (ref 0–149)
VLDL CHOLESTEROL CAL: 11 mg/dL (ref 5–40)

## 2016-10-22 LAB — HEMOGLOBIN A1C
Est. average glucose Bld gHb Est-mCnc: 206 mg/dL
HEMOGLOBIN A1C: 8.8 % — AB (ref 4.8–5.6)

## 2016-10-27 ENCOUNTER — Ambulatory Visit (INDEPENDENT_AMBULATORY_CARE_PROVIDER_SITE_OTHER): Payer: 59 | Admitting: Family Medicine

## 2016-10-27 VITALS — BP 132/84 | HR 86 | Temp 98.1°F | Resp 16 | Ht 70.0 in | Wt 271.6 lb

## 2016-10-27 DIAGNOSIS — R059 Cough, unspecified: Secondary | ICD-10-CM

## 2016-10-27 DIAGNOSIS — R05 Cough: Secondary | ICD-10-CM

## 2016-10-27 DIAGNOSIS — J069 Acute upper respiratory infection, unspecified: Secondary | ICD-10-CM | POA: Diagnosis not present

## 2016-10-27 DIAGNOSIS — R51 Headache: Secondary | ICD-10-CM | POA: Diagnosis not present

## 2016-10-27 DIAGNOSIS — R519 Headache, unspecified: Secondary | ICD-10-CM

## 2016-10-27 MED ORDER — AMOXICILLIN-POT CLAVULANATE 875-125 MG PO TABS: 1.0000 | ORAL_TABLET | Freq: Two times a day (BID) | ORAL | 0 refills | Status: DC

## 2016-10-27 MED ORDER — BENZONATATE 100 MG PO CAPS: 100.0000 mg | ORAL_CAPSULE | Freq: Three times a day (TID) | ORAL | 0 refills | Status: DC | PRN

## 2016-10-27 NOTE — Patient Instructions (Addendum)
Drink plenty of fluids and get enough rest  Take the Augmentin (amoxicillin/clavulanate) one twice daily at breakfast and supper  Take the benzonatate cough pills one or 2 pills 3 times daily as needed for cough. This will not cause drowsiness so can be taken in the daytime  Consider getting some over-the-counter Delsym and taking a dose of it at bedtime.  Return if if worse such as fevers or shortness of breath or coughing worse.    IF you received an x-ray today, you will receive an invoice from [facility]. Please contact [facility] at [phone] with questions or concerns regarding your invoice.   IF you received labwork today, you will receive an invoice from [facility]. Please contact [facility] at [phone] with questions or concerns regarding your invoice.   Our billing staff will not be able to assist you with questions regarding bills from these companies.  You will be contacted with the lab results as soon as they are available. The fastest way to get your results is to activate your My Chart account. Instructions are located on the last page of this paperwork. If you have not heard from [place] regarding the results in 2 weeks, please contact this office.

## 2016-10-27 NOTE — Progress Notes (Signed)
Patient ID: Sean Rogers, male    DOB: 10/28/1980  Age: 36 y.o. MRN: [id number]  Chief Complaint  Patient presents with  . Cough  . Headache    Subjective:   Sean Rogers is a 36 year old man who has diabetes and was in here 6 days ago for his regular diabetic check. He had had a URI for a couple of days at that point, but was told that it probably was just a little cold intolerant run its course. Weekend he is gradually gotten worse with more coughing. Cough is been bothering him at night. His wife is 8 months pregnant so he is been sleeping downstairs. The cough has bothered him and woke him up some. He is bringing up some green and gray and brown mucus. He does not smoke. He works at [employer]. He has not been running a fever.  Current allergies, medications, problem list, past/family and social histories reviewed.  Objective:  BP 132/84 (BP Location: Right Arm, Patient Position: Sitting, Cuff Size: Large)   Pulse 86   Temp 98.1 F (36.7 C) (Oral)   Resp 16   Ht 5\' 10"  (1.778 m)   Wt 271 lb 9.6 oz (123.2 kg)   SpO2 97%   BMI 38.97 kg/m   Healthy-appearing gentleman, does have a cough. His TMs normal. Throat clear. Next supple without significant nodes. Chest is clear to auscultation. Heart regular without murmur.  Assessment & Plan:   Assessment: 1. Cough   2. Nonintractable headache, unspecified chronicity pattern, unspecified headache type   3. Acute upper respiratory infection       Plan: Is been going on long enough and bringing up purulent phlegm such will go ahead and give him a round of antibiotics and hopefully get him cleared up quickly. See instructions.  No orders of the defined types were placed in this encounter.   Meds ordered this encounter  Medications  . dextromethorphan-guaiFENesin (MUCINEX DM) 30-600 MG 12hr tablet    Sig: Take 1 tablet by mouth 2 (two) times daily.  Marland Kitchen ibuprofen (ADVIL,MOTRIN) 200 MG tablet    Sig: Take 200 mg by mouth every 6  (six) hours as needed.  Marland Kitchen amoxicillin-clavulanate (AUGMENTIN) 875-125 MG tablet    Sig: Take 1 tablet by mouth 2 (two) times daily.    Dispense:  14 tablet    Refill:  0  . benzonatate (TESSALON) 100 MG capsule    Sig: Take 1-2 capsules (100-200 mg total) by mouth 3 (three) times daily as needed.    Dispense:  30 capsule    Refill:  0         Patient Instructions   Drink plenty of fluids and get enough rest  Take the Augmentin (amoxicillin/clavulanate) one twice daily at breakfast and supper  Take the benzonatate cough pills one or 2 pills 3 times daily as needed for cough. This will not cause drowsiness so can be taken in the daytime  Consider getting some over-the-counter Delsym and taking a dose of it at bedtime.  Return if if worse such as fevers or shortness of breath or coughing worse.    IF you received an x-ray today, you will receive an invoice from [facility]. Please contact [facility] at [phone] with questions or concerns regarding your invoice.   IF you received labwork today, you will receive an invoice from [facility]. Please contact [facility] at [phone] with questions or concerns regarding your invoice.   Our billing staff will not be able  to assist you with questions regarding bills from these companies.  You will be contacted with the lab results as soon as they are available. The fastest way to get your results is to activate your My Chart account. Instructions are located on the last page of this paperwork. If you have not heard from [place] regarding the results in 2 weeks, please contact this office.         Return if symptoms worsen or fail to improve.   Danett Palazzo, MD 10/27/2016

## 2016-11-23 ENCOUNTER — Other Ambulatory Visit: Payer: Self-pay | Admitting: Physician Assistant

## 2016-11-23 DIAGNOSIS — E119 Type 2 diabetes mellitus without complications: Secondary | ICD-10-CM

## 2016-12-19 ENCOUNTER — Other Ambulatory Visit: Payer: Self-pay | Admitting: Physician Assistant

## 2016-12-19 DIAGNOSIS — E119 Type 2 diabetes mellitus without complications: Secondary | ICD-10-CM

## 2016-12-27 ENCOUNTER — Other Ambulatory Visit: Payer: Self-pay | Admitting: Physician Assistant

## 2016-12-27 DIAGNOSIS — E119 Type 2 diabetes mellitus without complications: Secondary | ICD-10-CM

## 2017-01-26 ENCOUNTER — Encounter: Payer: Self-pay | Admitting: Physician Assistant

## 2017-01-26 ENCOUNTER — Other Ambulatory Visit: Payer: Self-pay | Admitting: Physician Assistant

## 2017-01-26 ENCOUNTER — Ambulatory Visit (INDEPENDENT_AMBULATORY_CARE_PROVIDER_SITE_OTHER): Payer: 59 | Admitting: Physician Assistant

## 2017-01-26 VITALS — BP 128/87 | HR 79 | Temp 97.4°F | Resp 18 | Ht 70.0 in | Wt 272.0 lb

## 2017-01-26 DIAGNOSIS — E119 Type 2 diabetes mellitus without complications: Secondary | ICD-10-CM

## 2017-01-26 DIAGNOSIS — E7800 Pure hypercholesterolemia, unspecified: Secondary | ICD-10-CM | POA: Diagnosis not present

## 2017-01-26 DIAGNOSIS — I10 Essential (primary) hypertension: Secondary | ICD-10-CM | POA: Diagnosis not present

## 2017-01-26 MED ORDER — CARVEDILOL 3.125 MG PO TABS
ORAL_TABLET | ORAL | 1 refills | Status: DC
Start: 2017-01-26 — End: 2017-04-27

## 2017-01-26 MED ORDER — CANAGLIFLOZIN 100 MG PO TABS: 100.0000 mg | ORAL_TABLET | Freq: Every day | ORAL | 1 refills | Status: DC

## 2017-01-26 MED ORDER — LISINOPRIL 20 MG PO TABS: ORAL_TABLET | ORAL | 1 refills | Status: DC

## 2017-01-26 MED ORDER — SIMVASTATIN 40 MG PO TABS: 40.0000 mg | ORAL_TABLET | Freq: Every day | ORAL | 1 refills | Status: DC

## 2017-01-26 MED ORDER — METFORMIN HCL 1000 MG PO TABS: 1000.0000 mg | ORAL_TABLET | Freq: Two times a day (BID) | ORAL | 1 refills | Status: DC

## 2017-01-26 NOTE — Telephone Encounter (Signed)
Patient was seen today.

## 2017-01-26 NOTE — Patient Instructions (Signed)
Please when you have your eye exam get them to send me the results.      IF you received an x-ray today, you will receive an invoice from [facility]. Please contact [facility] at [phone] with questions or concerns regarding your invoice.   IF you received labwork today, you will receive an invoice from [facility]. Please contact [facility] at [phone] with questions or concerns regarding your invoice.   Our billing staff will not be able to assist you with questions regarding bills from these companies.  You will be contacted with the lab results as soon as they are available. The fastest way to get your results is to activate your My Chart account. Instructions are located on the last page of this paperwork. If you have not heard from [place] regarding the results in 2 weeks, please contact this office.

## 2017-01-26 NOTE — Progress Notes (Signed)
Sean Rogers  MRN: [id number] DOB: Oct 05, 1980  PCP: Mancel Bale, PA-C  Chief Complaint  Patient presents with  . Diabetes    follow up     Subjective:  Pt presents to clinic for medication refill.  He feels good.  He is a new father and things have been going well.  They have been eating out more due to confusion in the home with a new baby.  Home glucose reading this am - 175 fasting -   Less potatoes - cooking more at home - more veggies - planning on getting some more exercise but has not started with that yet.  Review of Systems  Constitutional: Negative for chills and fever.  Respiratory: Negative for cough and shortness of breath.   Cardiovascular: Negative for chest pain, palpitations and leg swelling.  Neurological: Negative for numbness.    Patient Active Problem List   Diagnosis Date Noted  . Headache(784.0) 08/31/2013  . Nonischemic cardiomyopathy - lock to be viral; EF 35-40% 08/30/2013    Class: Diagnosis of  . Controlled type 2 diabetes mellitus without complication, without long-term current use of insulin ([place]) 08/05/2011  . Obesity, Class II, BMI 35-39.9 08/05/2011  . Hypertension 08/05/2011  . Hyperlipidemia 08/05/2011    Current Outpatient Prescriptions on File Prior to Visit  Medication Sig Dispense Refill  . aspirin EC 81 MG EC tablet Take 1 tablet (81 mg total) by mouth daily. 30 tablet 0  . glucose blood test strip Use as directed once a day.  DX: E11.65 50 each 12   No current facility-administered medications on file prior to visit.     No Known Allergies  Pt patients past, family and social history were reviewed and updated.   Objective:  BP 128/87   Pulse 79   Temp (!) 97.4 F (36.3 C) (Oral)   Resp 18   Ht '5\' 10"'$  (1.778 m)   Wt 272 lb (123.4 kg)   SpO2 99%   BMI 39.03 kg/m   Physical Exam  Constitutional: He is oriented to person, place, and time and well-developed, well-nourished, and in no distress.  HENT:  Head:  Normocephalic and atraumatic.  Right Ear: External ear normal.  Left Ear: External ear normal.  Eyes: Conjunctivae are normal.  Neck: Normal range of motion.  Cardiovascular: Normal rate, regular rhythm, normal heart sounds and intact distal pulses.   Pulmonary/Chest: Effort normal and breath sounds normal. He has no wheezes.  Musculoskeletal:       Right lower leg: He exhibits no edema.       Left lower leg: He exhibits no edema.  Neurological: He is alert and oriented to person, place, and time. Gait normal.  Skin: Skin is warm and dry.  Psychiatric: Mood, memory, affect and judgment normal.    Wt Readings from Last 3 Encounters:  01/26/17 272 lb (123.4 kg)  10/27/16 271 lb 9.6 oz (123.2 kg)  10/21/16 276 lb (125.2 kg)    Assessment and Plan :  Pure hypercholesterolemia - Plan: simvastatin (ZOCOR) 40 MG tablet - continue medications- labs checked 3 months ago and level was normal  Controlled type 2 diabetes mellitus without complication, without long-term current use of insulin (HCC) - Plan: Hemoglobin A1c, metFORMIN (GLUCOPHAGE) 1000 MG tablet, canagliflozin (INVOKANA) 100 MG TABS tablet - check labs at last visit his A1C was elevated he has made some diet changes - we did discuss Trulicity/bydureon vs Jardiance if another medication is needed  Essential hypertension -  Plan: CMP14+EGFR, lisinopril (PRINIVIL,ZESTRIL) 20 MG tablet, carvedilol (COREG) 3.125 MG tablet good control continue medications  Pt plans to get eye exam and have the information sent to me.  Windell Hummingbird PA-C  [facility] at [facility] 01/26/2017 4:11 PM

## 2017-01-27 LAB — CMP14+EGFR
ALBUMIN: 4 g/dL (ref 3.5–5.5)
ALK PHOS: 103 IU/L (ref 39–117)
ALT: 15 IU/L (ref 0–44)
AST: 17 IU/L (ref 0–40)
Albumin/Globulin Ratio: 1.3 (ref 1.2–2.2)
BUN / CREAT RATIO: 11 (ref 9–20)
BUN: 13 mg/dL (ref 6–20)
Bilirubin Total: 0.3 mg/dL (ref 0.0–1.2)
CALCIUM: 9.8 mg/dL (ref 8.7–10.2)
CHLORIDE: 99 mmol/L (ref 96–106)
CO2: 24 mmol/L (ref 20–29)
CREATININE: 1.17 mg/dL (ref 0.76–1.27)
GFR calc Af Amer: 92 mL/min/{1.73_m2} (ref >59)
GFR, EST NON AFRICAN AMERICAN: 79 mL/min/{1.73_m2} (ref >59)
GLOBULIN, TOTAL: 3.2 g/dL (ref 1.5–4.5)
Glucose: 133 mg/dL — ABNORMAL HIGH (ref 65–99)
POTASSIUM: 4.5 mmol/L (ref 3.5–5.2)
SODIUM: 139 mmol/L (ref 134–144)
Total Protein: 7.2 g/dL (ref 6.0–8.5)

## 2017-01-27 LAB — HEMOGLOBIN A1C
Est. average glucose Bld gHb Est-mCnc: 186 mg/dL
Hgb A1c MFr Bld: 8.1 % — ABNORMAL HIGH (ref 4.8–5.6)

## 2017-04-27 ENCOUNTER — Encounter: Payer: Self-pay | Admitting: Physician Assistant

## 2017-04-27 ENCOUNTER — Ambulatory Visit (INDEPENDENT_AMBULATORY_CARE_PROVIDER_SITE_OTHER): Payer: 59 | Admitting: Physician Assistant

## 2017-04-27 VITALS — BP 126/86 | HR 88 | Temp 98.4°F | Resp 18 | Ht 70.0 in | Wt 259.2 lb

## 2017-04-27 DIAGNOSIS — E669 Obesity, unspecified: Secondary | ICD-10-CM

## 2017-04-27 DIAGNOSIS — I10 Essential (primary) hypertension: Secondary | ICD-10-CM | POA: Diagnosis not present

## 2017-04-27 DIAGNOSIS — Z23 Encounter for immunization: Secondary | ICD-10-CM

## 2017-04-27 DIAGNOSIS — E119 Type 2 diabetes mellitus without complications: Secondary | ICD-10-CM

## 2017-04-27 DIAGNOSIS — E7800 Pure hypercholesterolemia, unspecified: Secondary | ICD-10-CM | POA: Diagnosis not present

## 2017-04-27 MED ORDER — METFORMIN HCL 1000 MG PO TABS: 1000.0000 mg | ORAL_TABLET | Freq: Two times a day (BID) | ORAL | 1 refills | Status: DC

## 2017-04-27 MED ORDER — CARVEDILOL 3.125 MG PO TABS: ORAL_TABLET | ORAL | 1 refills | Status: DC

## 2017-04-27 MED ORDER — CANAGLIFLOZIN 100 MG PO TABS: 100.0000 mg | ORAL_TABLET | Freq: Every day | ORAL | 1 refills | Status: DC

## 2017-04-27 MED ORDER — LISINOPRIL 20 MG PO TABS: ORAL_TABLET | ORAL | 1 refills | Status: DC

## 2017-04-27 NOTE — Patient Instructions (Addendum)
     IF you received an x-ray today, you will receive an invoice from [facility]. Please contact [facility] at [phone] with questions or concerns regarding your invoice.   IF you received labwork today, you will receive an invoice from [facility]. Please contact [facility] at [phone] with questions or concerns regarding your invoice.   Our billing staff will not be able to assist you with questions regarding bills from these companies.  You will be contacted with the lab results as soon as they are available. The fastest way to get your results is to activate your My Chart account. Instructions are located on the last page of this paperwork. If you have not heard from [place] regarding the results in 2 weeks, please contact this office.    [email or web address]

## 2017-04-27 NOTE — Progress Notes (Signed)
Sean Rogers  MRN: [id number] DOB: 1981/03/04  PCP: Mancel Bale, PA-C  Chief Complaint  Patient presents with  . Diabetes    missed some meds due to money issues     Subjective:  Pt presents to clinic for medication management.  No home glucose monitoring.  He did go a month without his medication due to financial reasons about 3 months ago but for the last 2 months he has been on his medications.  He feels good.  He has been eating out more recently and not always making the best choices in regards to food.  Plans to schedule his eye exam.  History is obtained by patient.  Review of Systems  Constitutional: Negative for chills and fever.  Eyes: Negative for visual disturbance.  Respiratory: Negative for cough and shortness of breath.   Cardiovascular: Negative for chest pain, palpitations and leg swelling.  Neurological: Negative for dizziness, light-headedness, numbness and headaches.    Patient Active Problem List   Diagnosis Date Noted  . Headache(784.0) 08/31/2013  . Nonischemic cardiomyopathy - lock to be viral; EF 35-40% 08/30/2013    Class: Diagnosis of  . Controlled type 2 diabetes mellitus without complication, without long-term current use of insulin ([place]) 08/05/2011  . Obesity, Class II, BMI 35-39.9 08/05/2011  . Hypertension 08/05/2011  . Hyperlipidemia 08/05/2011    Current Outpatient Prescriptions on File Prior to Visit  Medication Sig Dispense Refill  . aspirin EC 81 MG EC tablet Take 1 tablet (81 mg total) by mouth daily. 30 tablet 0  . glucose blood test strip Use as directed once a day.  DX: E11.65 50 each 12  . simvastatin (ZOCOR) 40 MG tablet Take 1 tablet (40 mg total) by mouth daily. 90 tablet 1   No current facility-administered medications on file prior to visit.     No Known Allergies  Past Medical History:  Diagnosis Date  . Diabetes mellitus   . Hyperlipidemia   . Hypertension   . Nonischemic cardiomyopathy ([place]) February 2014     EF 35% by stress echo with no ischemic findings.-  Followup echo EF 35-40%  . Obesity    Social History   Social History Narrative   He is married.    Son - born 11/29/2016   Does not drink and does not smoke alcohol.   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   Social History  Substance Use Topics  . Smoking status: Never Smoker  . Smokeless tobacco: Never Used  . Alcohol use No   family history includes Cancer in his maternal grandmother; Diabetes in his maternal uncle, mother, and sister; Hyperlipidemia in his mother; Hypertension in his mother.     Objective:  BP 126/86   Pulse 88   Temp 98.4 F (36.9 C) (Oral)   Resp 18   Ht '5\' 10"'$  (1.778 m)   Wt 259 lb 3.2 oz (117.6 kg)   SpO2 98%   BMI 37.19 kg/m  Body mass index is 37.19 kg/m.  Physical Exam  Constitutional: He is oriented to person, place, and time and well-developed, well-nourished, and in no distress.  HENT:  Head: Normocephalic and atraumatic.  Right Ear: External ear normal.  Left Ear: External ear normal.  Eyes: Conjunctivae are normal.  Neck: Normal range of motion.  Cardiovascular: Normal rate, regular rhythm, normal heart sounds and intact distal pulses.   Pulmonary/Chest: Effort normal and breath sounds normal. He has no wheezes.  Musculoskeletal:       Right lower leg: He exhibits no edema.       Left lower leg: He exhibits no edema.  Neurological: He is alert and oriented to person, place, and time. Gait normal.  Skin: Skin is warm and dry.  Psychiatric: Mood, memory, affect and judgment normal.    Wt Readings from Last 3 Encounters:  04/27/17 259 lb 3.2 oz (117.6 kg)  01/26/17 272 lb (123.4 kg)  10/27/16 271 lb 9.6 oz (123.2 kg)    Assessment and Plan :  Controlled type 2 diabetes mellitus without complication, without long-term current use of insulin (HCC) - Plan: CMP14+EGFR, Hemoglobin A1c, HM DIABETES FOOT EXAM, metFORMIN (GLUCOPHAGE) 1000 MG tablet,  canagliflozin (INVOKANA) 100 MG TABS tablet, Microalbumin, urine - refill medications - continue medications - we talked about maybe finding pharmacies through [facility] where his medications might be cheaper  Need for influenza vaccination - Plan: Flu Vaccine QUAD 36+ mos IM  Essential hypertension - Plan: lisinopril (PRINIVIL,ZESTRIL) 20 MG tablet, carvedilol (COREG) 3.125 MG tablet - well controlled  Pure hypercholesterolemia - Plan: Lipid panel - check labs to see how his recent change in diet has affected his results  Obesity, Class II, BMI 35-39.9 - pt has lost some weight - he was encouraged to continue healthly lifestyle choices.  Windell Hummingbird PA-C  [facility] at [facility] 04/27/2017 4:54 PM

## 2017-04-28 LAB — CMP14+EGFR
A/G RATIO: 1.5 (ref 1.2–2.2)
ALT: 20 IU/L (ref 0–44)
AST: 22 IU/L (ref 0–40)
Albumin: 4.4 g/dL (ref 3.5–5.5)
Alkaline Phosphatase: 109 IU/L (ref 39–117)
BUN/Creatinine Ratio: 10 (ref 9–20)
BUN: 11 mg/dL (ref 6–20)
Bilirubin Total: 0.6 mg/dL (ref 0.0–1.2)
CALCIUM: 9.9 mg/dL (ref 8.7–10.2)
CO2: 23 mmol/L (ref 20–29)
Chloride: 99 mmol/L (ref 96–106)
Creatinine, Ser: 1.15 mg/dL (ref 0.76–1.27)
GFR, EST AFRICAN AMERICAN: 93 mL/min/{1.73_m2} (ref >59)
GFR, EST NON AFRICAN AMERICAN: 80 mL/min/{1.73_m2} (ref >59)
GLUCOSE: 172 mg/dL — AB (ref 65–99)
Globulin, Total: 3 g/dL (ref 1.5–4.5)
POTASSIUM: 4.2 mmol/L (ref 3.5–5.2)
Sodium: 140 mmol/L (ref 134–144)
TOTAL PROTEIN: 7.4 g/dL (ref 6.0–8.5)

## 2017-04-28 LAB — LIPID PANEL
CHOLESTEROL TOTAL: 192 mg/dL (ref 100–199)
Chol/HDL Ratio: 4.5 ratio (ref 0.0–5.0)
HDL: 43 mg/dL (ref >39)
LDL Calculated: 137 mg/dL — ABNORMAL HIGH (ref 0–99)
TRIGLYCERIDES: 58 mg/dL (ref 0–149)
VLDL CHOLESTEROL CAL: 12 mg/dL (ref 5–40)

## 2017-04-28 LAB — HEMOGLOBIN A1C
ESTIMATED AVERAGE GLUCOSE: 249 mg/dL
Hgb A1c MFr Bld: 10.3 % — ABNORMAL HIGH (ref 4.8–5.6)

## 2017-04-28 LAB — MICROALBUMIN, URINE: MICROALBUM., U, RANDOM: 11.8 ug/mL

## 2017-09-27 ENCOUNTER — Encounter: Payer: Self-pay | Admitting: Physician Assistant

## 2017-10-05 ENCOUNTER — Encounter: Payer: Self-pay | Admitting: Physician Assistant

## 2017-10-05 ENCOUNTER — Other Ambulatory Visit: Payer: Self-pay

## 2017-10-05 ENCOUNTER — Ambulatory Visit: Payer: 59 | Admitting: Physician Assistant

## 2017-10-05 ENCOUNTER — Ambulatory Visit (INDEPENDENT_AMBULATORY_CARE_PROVIDER_SITE_OTHER): Payer: 59

## 2017-10-05 VITALS — BP 138/86 | HR 95 | Temp 98.7°F | Resp 18 | Ht 70.0 in | Wt 263.0 lb

## 2017-10-05 DIAGNOSIS — M25532 Pain in left wrist: Secondary | ICD-10-CM

## 2017-10-05 MED ORDER — MELOXICAM 7.5 MG PO TABS: 7.5000 mg | ORAL_TABLET | Freq: Every day | ORAL | 1 refills | Status: DC

## 2017-10-05 NOTE — Patient Instructions (Addendum)
De Quervain Tenosynovitis Tendons attach muscles to bones. They also help with joint movements. When tendons become irritated or swollen, it is called tendinitis. The extensor pollicis brevis (EPB) tendon connects the EPB muscle to a bone that is near the base of the thumb. The EPB muscle helps to straighten and extend the thumb. De Quervain tenosynovitis is a condition in which the EPB tendon lining (sheath) becomes irritated, thickened, and swollen. This condition is sometimes called stenosing tenosynovitis. This condition causes pain on the thumb side of the back of the wrist. What are the causes? Causes of this condition include:  Activities that repeatedly cause your thumb and wrist to extend.  A sudden increase in activity or change in activity that affects your wrist.  What increases the risk? This condition is more likely to develop in:  Females.  People who have diabetes.  Women who have recently given birth.  People who are over 39 years of age.  People who do activities that involve repeated hand and wrist motions, such as tennis, racquetball, volleyball, gardening, and taking care of children.  People who do heavy labor.  People who have poor wrist strength and flexibility.  People who do not warm up properly before activities.  What are the signs or symptoms? Symptoms of this condition include:  Pain or tenderness over the thumb side of the back of the wrist when your thumb and wrist are not moving.  Pain that gets worse when you straighten your thumb or extend your thumb or wrist.  Pain when the injured area is touched.  Locking or catching of the thumb joint while you bend and straighten your thumb.  Decreased thumb motion due to pain.  Swelling over the affected area.  How is this diagnosed? This condition is diagnosed with a medical history and physical exam. Your health care provider will ask for details about your injury and ask about your  symptoms. How is this treated? Treatment may include the use of icing and medicines to reduce pain and swelling. You may also be advised to wear a splint or brace to limit your thumb and wrist motion. In less severe cases, treatment may also include working with a physical therapist to strengthen your wrist and calm the irritation around your EPB tendon sheath. In severe cases, surgery may be needed. Follow these instructions at home: If you have a splint or brace:  Wear it as told by your health care provider. Remove it only as told by your health care provider.  Loosen the splint or brace if your fingers become numb and tingle, or if they turn cold and blue.  Keep the splint or brace clean and dry. Managing pain, stiffness, and swelling  If directed, apply ice to the injured area. ? Put ice in a plastic bag. ? Place a towel between your skin and the bag. ? Leave the ice on for 20 minutes, 2-3 times per day.  Move your fingers often to avoid stiffness and to lessen swelling.  Raise (elevate) the injured area above the level of your heart while you are sitting or lying down. General instructions  Return to your normal activities as told by your health care provider. Ask your health care provider what activities are safe for you.  Take over-the-counter and prescription medicines only as told by your health care provider.  Keep all follow-up visits as told by your health care provider. This is important.  Do not drive or operate heavy machinery while taking  prescription pain medicine. Contact a health care provider if:  Your pain, tenderness, or swelling gets worse, even if you have had treatment.  You have numbness or tingling in your wrist, hand, or fingers on the injured side. This information is not intended to replace advice given to you by your health care provider. Make sure you discuss any questions you have with your health care provider. Document Released: 07/13/2005  Document Revised: 12/19/2015 Document Reviewed: 09/18/2014 Elsevier Interactive Patient Education  2018 [employer].     IF you received an x-ray today, you will receive an invoice from [facility]. Please contact [facility] at [phone] with questions or concerns regarding your invoice.   IF you received labwork today, you will receive an invoice from [facility]. Please contact [facility] at [phone] with questions or concerns regarding your invoice.   Our billing staff will not be able to assist you with questions regarding bills from these companies.  You will be contacted with the lab results as soon as they are available. The fastest way to get your results is to activate your My Chart account. Instructions are located on the last page of this paperwork. If you have not heard from [place] regarding the results in 2 weeks, please contact this office.

## 2017-10-05 NOTE — Progress Notes (Signed)
Sean Rogers  MRN: [id number] DOB: 1980-11-18  PCP: Morrell Riddle, PA-C  Chief Complaint  Patient presents with  . Wrist Pain    left wrist x2weeks pt heard it popped, pt states no known injury     Subjective:  Pt presents to clinic for left wrist pain that has been present for two weeks. Patient states that pain is located from his left thumb to a quarter up is forearm. Patient is right hand dominant. Patient states it hurts when he picks up his son, any movement of the thumb exacerbates pain, and opposition between thumb and first phalange is painful. He has no injury to the area that he is aware of.  Patient has not tried taking Ibuprofen, Tylenol, or icing the area to improve the symptom. Patient rates pain as a 8/10 when its bad, it is a 6/10 right now.  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  History is obtained by patient.  Review of Systems  Constitutional: Positive for activity change (Pain has made job harder as it requires alot of hand use.). Negative for appetite change, fatigue and fever.  HENT: Negative.  Negative for congestion, ear pain, sinus pain and tinnitus.   Eyes: Negative for photophobia and visual disturbance.  Respiratory: Negative.  Negative for cough, chest tightness and shortness of breath.   Cardiovascular: Negative.  Negative for chest pain and palpitations.  Gastrointestinal: Negative.  Negative for constipation, diarrhea, nausea and vomiting.  Endocrine: Negative.  Negative for polydipsia and polyuria.  Genitourinary: Negative.  Negative for difficulty urinating, dysuria, frequency and urgency.  Musculoskeletal: Positive for arthralgias. Negative for back pain, joint swelling, myalgias, neck pain and neck stiffness.  Skin: Negative.  Negative for color change and wound.  Neurological: Negative.  Negative for dizziness, light-headedness, numbness and headaches.    Patient Active Problem List   Diagnosis Date Noted  .  Headache(784.0) 08/31/2013  . Nonischemic cardiomyopathy - lock to be viral; EF 35-40% 08/30/2013    Class: Diagnosis of  . Controlled type 2 diabetes mellitus without complication, without long-term current use of insulin (HCC) 08/05/2011  . Obesity, Class II, BMI 35-39.9 08/05/2011  . Hypertension 08/05/2011  . Hyperlipidemia 08/05/2011    Current Outpatient Medications on File Prior to Visit  Medication Sig Dispense Refill  . aspirin EC 81 MG EC tablet Take 1 tablet (81 mg total) by mouth daily. 30 tablet 0  . canagliflozin (INVOKANA) 100 MG TABS tablet Take 1 tablet (100 mg total) by mouth daily. 90 tablet 1  . carvedilol (COREG) 3.125 MG tablet TAKE ONE TABLET BY MOUTH TWICE DAILY. TAKE WITH A MEAL. 180 tablet 1  . glucose blood test strip Use as directed once a day.  DX: E11.65 50 each 12  . lisinopril (PRINIVIL,ZESTRIL) 20 MG tablet TAKE ONE TABLET BY MOUTH ONE TIME DAILY. 90 tablet 1  . metFORMIN (GLUCOPHAGE) 1000 MG tablet Take 1 tablet (1,000 mg total) by mouth 2 (two) times daily with a meal. 180 tablet 1   No current facility-administered medications on file prior to visit.     No Known Allergies  Past Medical History:  Diagnosis Date  . Diabetes mellitus   . Hyperlipidemia   . Hypertension   . Nonischemic cardiomyopathy (HCC) February 2014   EF 35% by stress echo with no ischemic findings.-  Followup echo EF 35-40%  . Obesity    Social History   Social History Narrative   He  is married.    Son - born 11/29/2016   Does not drink and does not smoke alcohol.   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   Social History   Tobacco Use  . Smoking status: Never Smoker  . Smokeless tobacco: Never Used  Substance Use Topics  . Alcohol use: No  . Drug use: No   family history includes Cancer in his maternal grandmother; Diabetes in his maternal uncle, mother, and sister; Hyperlipidemia in his mother; Hypertension in his mother.       Objective:  BP 138/86   Pulse 95   Temp 98.7 F (37.1 C) (Oral)   Resp 18   Ht 5\' 10"  (1.778 m)   Wt 263 lb (119.3 kg)   SpO2 100%   BMI 37.74 kg/m  Body mass index is 37.74 kg/m.  Physical Exam  Constitutional: He is oriented to person, place, and time and well-developed, well-nourished, and in no distress. No distress.  BP 138/86   Pulse 95   Temp 98.7 F (37.1 C) (Oral)   Resp 18   Ht 5\' 10"  (1.778 m)   Wt 263 lb (119.3 kg)   SpO2 100%   BMI 37.74 kg/m   HENT:  Head: Normocephalic and atraumatic.  Right Ear: External ear normal.  Left Ear: External ear normal.  Nose: Nose normal.  Mouth/Throat: Oropharynx is clear and moist.  Eyes: Conjunctivae and EOM are normal. Pupils are equal, round, and reactive to light.  Neck: Normal range of motion. Neck supple.  Cardiovascular: Normal rate, regular rhythm, normal heart sounds and intact distal pulses. Exam reveals no gallop and no friction rub.  No murmur heard. Pulmonary/Chest: Effort normal and breath sounds normal.  Abdominal: Soft. Bowel sounds are normal.  Musculoskeletal: Normal range of motion. He exhibits tenderness (TTP near snuff box ? bony tenderness or soft tissue - no ecchymosis present).  Positive Finkelstein's sign.  Neurological: He is alert and oriented to person, place, and time. He has normal reflexes. Gait normal. GCS score is 15.  Skin: Skin is warm and dry. No rash noted. He is not diaphoretic. No erythema.  Psychiatric: Mood, memory, affect and judgment normal.   EXAM: LEFT WRIST - COMPLETE 3+ VIEW  COMPARISON:  None.  FINDINGS: There is no evidence of fracture or dislocation. There is no evidence of arthropathy or other focal bone abnormality. Soft tissues are unremarkable.  IMPRESSION: Normal left wrist.  Assessment and Plan :  Acute pain of left wrist - Plan: DG Wrist Complete Left   Patient was prescribed Meloxicam to reduce pain and inflammation of left wrist. 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 Due to positive anatomical snuff box tenderness, x-ray was taken of his left wrist to rule out bony abnormalities which were negative. Patient will be placed in a wrist splint and was advised to ice the painful area as often as possible.    Follow up in two weeks for re-evaluation of acute left wrist pain. If not improved consider hand rehab as they might be able to use [place] with ionophoresis to shorten recovery time.  Benny Lennert PA-C  Primary Care at [facility] 10/05/2017 2:22 PM

## 2017-12-01 ENCOUNTER — Other Ambulatory Visit: Payer: Self-pay | Admitting: Physician Assistant

## 2017-12-01 DIAGNOSIS — M25532 Pain in left wrist: Secondary | ICD-10-CM

## 2017-12-01 NOTE — Telephone Encounter (Signed)
Patient is requesting a refill of the following medications: Requested Prescriptions   Pending Prescriptions Disp Refills  . meloxicam (MOBIC) 7.5 MG tablet [Pharmacy Med Name: MELOXICAM 7.5 MG TABLET] 60 tablet 1    Sig: TAKE 1-2 TABLETS (7.5-15 MG TOTAL) BY MOUTH DAILY.    Date of patient request: 12/01/2017 Last office visit: 10/05/2017 Date of last refill: 10/05/2017 Last refill amount: 60 Follow up time period per chart: 2 week follow up

## 2018-01-27 ENCOUNTER — Other Ambulatory Visit: Payer: Self-pay | Admitting: Physician Assistant

## 2018-01-27 DIAGNOSIS — M25532 Pain in left wrist: Secondary | ICD-10-CM

## 2018-01-28 NOTE — Telephone Encounter (Signed)
Meloxicam refill Last Refill:12/06/17 # 60 1 RF Last OV: 10/05/17 PCP: Benny Lennert PA [facility]acy:[facility] [address]

## 2018-02-05 ENCOUNTER — Other Ambulatory Visit: Payer: Self-pay | Admitting: Physician Assistant

## 2018-02-05 DIAGNOSIS — E119 Type 2 diabetes mellitus without complications: Secondary | ICD-10-CM

## 2018-02-05 DIAGNOSIS — I10 Essential (primary) hypertension: Secondary | ICD-10-CM

## 2018-02-15 ENCOUNTER — Other Ambulatory Visit: Payer: Self-pay

## 2018-02-15 ENCOUNTER — Ambulatory Visit: Payer: 59 | Admitting: Physician Assistant

## 2018-02-15 ENCOUNTER — Encounter: Payer: Self-pay | Admitting: Physician Assistant

## 2018-02-15 VITALS — BP 120/88 | HR 89 | Temp 98.8°F | Resp 18 | Ht 70.0 in | Wt 257.8 lb

## 2018-02-15 DIAGNOSIS — I10 Essential (primary) hypertension: Secondary | ICD-10-CM | POA: Diagnosis not present

## 2018-02-15 DIAGNOSIS — E119 Type 2 diabetes mellitus without complications: Secondary | ICD-10-CM

## 2018-02-15 DIAGNOSIS — M778 Other enthesopathies, not elsewhere classified: Secondary | ICD-10-CM

## 2018-02-15 DIAGNOSIS — M25532 Pain in left wrist: Secondary | ICD-10-CM | POA: Diagnosis not present

## 2018-02-15 DIAGNOSIS — E7800 Pure hypercholesterolemia, unspecified: Secondary | ICD-10-CM | POA: Diagnosis not present

## 2018-02-15 MED ORDER — METFORMIN HCL 1000 MG PO TABS: 1000.0000 mg | ORAL_TABLET | Freq: Two times a day (BID) | ORAL | 1 refills | Status: AC

## 2018-02-15 MED ORDER — MELOXICAM 7.5 MG PO TABS: 7.5000 mg | ORAL_TABLET | Freq: Every day | ORAL | 1 refills | Status: DC

## 2018-02-15 MED ORDER — CARVEDILOL 3.125 MG PO TABS: 3.1250 mg | ORAL_TABLET | Freq: Two times a day (BID) | ORAL | 1 refills | Status: AC

## 2018-02-15 MED ORDER — CANAGLIFLOZIN 100 MG PO TABS
100.0000 mg | ORAL_TABLET | Freq: Every day | ORAL | 1 refills | Status: AC
Start: 2018-02-15 — End: ?

## 2018-02-15 MED ORDER — METFORMIN HCL 1000 MG PO TABS: 1000.0000 mg | ORAL_TABLET | Freq: Two times a day (BID) | ORAL | 0 refills | Status: DC

## 2018-02-15 MED ORDER — LISINOPRIL 20 MG PO TABS: ORAL_TABLET | ORAL | 0 refills | Status: DC

## 2018-02-15 MED ORDER — LISINOPRIL 20 MG PO TABS: ORAL_TABLET | ORAL | 1 refills | Status: AC

## 2018-02-15 NOTE — Progress Notes (Signed)
Sean Rogers  MRN: [id number] DOB: 1980-11-27  PCP: Mancel Bale, PA-C  Chief Complaint  Patient presents with  . Medication Refill    medication follow up     Subjective:  Pt presents to clinic for medication recheck.  He has been doing ok.  He has not been checking his sugars because he does not like to.  He feels good.  Paperwork perfect yet you are done  He has been eating ok.  Limited sodas and sweets. No BP checks at home.  Not checking glucose regularly at home. He has planned to start going back to the gym.  History is obtained by patient.  Review of Systems  Constitutional: Negative for chills and fever.  Eyes: Negative for visual disturbance.  Respiratory: Negative for cough and shortness of breath.   Cardiovascular: Negative for chest pain, palpitations and leg swelling.  Neurological: Negative for dizziness, light-headedness, numbness and headaches.    Patient Active Problem List   Diagnosis Date Noted  . Headache(784.0) 08/31/2013  . Nonischemic cardiomyopathy - lock to be viral; EF 35-40% 08/30/2013    Class: Diagnosis of  . Controlled type 2 diabetes mellitus without complication, without long-term current use of insulin ([place]) 08/05/2011  . Obesity, Class II, BMI 35-39.9 08/05/2011  . Hypertension 08/05/2011  . Hyperlipidemia 08/05/2011    Current Outpatient Medications on File Prior to Visit  Medication Sig Dispense Refill  . aspirin EC 81 MG EC tablet Take 1 tablet (81 mg total) by mouth daily. 30 tablet 0  . glucose blood test strip Use as directed once a day.  DX: E11.65 50 each 12   No current facility-administered medications on file prior to visit.     No Known Allergies  Past Medical History:  Diagnosis Date  . Diabetes mellitus   . Hyperlipidemia   . Hypertension   . Nonischemic cardiomyopathy ([place]) February 2014   EF 35% by stress echo with no ischemic findings.-  Followup echo EF 35-40%  . Obesity    Social History   Social  History Narrative   He is married.    Son - born 11/29/2016   Does not drink and does not smoke alcohol.   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   Social History   Tobacco Use  . Smoking status: Never Smoker  . Smokeless tobacco: Never Used  Substance Use Topics  . Alcohol use: No  . Drug use: No   family history includes Cancer in his maternal grandmother; Diabetes in his maternal uncle, mother, and sister; Hyperlipidemia in his mother; Hypertension in his mother.     Objective:  BP 120/88   Pulse 89   Temp 98.8 F (37.1 C) (Oral)   Resp 18   Ht '5\' 10"'$  (1.778 m)   Wt 257 lb 12.8 oz (116.9 kg)   SpO2 99%   BMI 36.99 kg/m   Body mass index is 36.99 kg/m.  Wt Readings from Last 3 Encounters:  02/15/18 257 lb 12.8 oz (116.9 kg)  10/05/17 263 lb (119.3 kg)  04/27/17 259 lb 3.2 oz (117.6 kg)    Physical Exam  Constitutional: He is oriented to person, place, and time. He appears well-developed and well-nourished.  HENT:  Head: Normocephalic and atraumatic.  Right Ear: External ear normal.  Left Ear: External ear normal.  Eyes: Conjunctivae are normal.  Neck: Normal range of motion.  Cardiovascular: Normal rate, regular rhythm,  normal heart sounds and intact distal pulses.  Pulmonary/Chest: Effort normal and breath sounds normal. He has no wheezes.  Musculoskeletal:       Left wrist: He exhibits tenderness (thumb extensor tendons). He exhibits normal range of motion.       Right lower leg: He exhibits no edema.       Left lower leg: He exhibits no edema.  Neg finkelstein test.  Pain with thumb extension that increases with strength testing.  No TTP in wrist.  Neurological: He is alert and oriented to person, place, and time.  Skin: Skin is warm and dry.  Psychiatric: Judgment normal.  Vitals reviewed.   Assessment and Plan :  Controlled type 2 diabetes mellitus without complication, without long-term  current use of insulin (HCC) - Plan: CMP14+EGFR, Hemoglobin A1c, canagliflozin (INVOKANA) 100 MG TABS tablet, metFORMIN (GLUCOPHAGE) 1000 MG tablet, DISCONTINUED: metFORMIN (GLUCOPHAGE) 1000 MG tablet - check labs, Withcontinue medications.  Discussed with patient starting a GLP 1 if his labs are still elevated.  He is very reluctant to start this due to the injectable nature of this medication.  This epic is supposed to be coming out with an oral agent later this fall so we may bridge Januvia until that time to see if we can get some better control.  Essential hypertension - Plan: carvedilol (COREG) 3.125 MG tablet, lisinopril (PRINIVIL,ZESTRIL) 20 MG tablet, DISCONTINUED: lisinopril (PRINIVIL,ZESTRIL) 20 MG tablet -control blood pressure  Acute pain of left wrist - Plan: Ambulatory referral to Sports Medicine, meloxicam (MOBIC) 7.5 MG tablet, DISCONTINUED: meloxicam (MOBIC) 7.5 MG tablet -patient has had this wrist pain for at least 3 months he has been wearing a thumb spica splint which helps while at work due to his heavy lifting especially in repetitive nature.  I think he might benefit from an injection to this area.  Pure hypercholesterolemia - Plan: Lipid panel -check labs to see change patient is currently not on a statin even with diabetes diagnosis.  Tendonitis of wrist, left - Plan: Ambulatory referral to Sports Medicine  Patient verbalized to me that they understand the following: diagnosis, what is being done for them, what to expect and what should be done at home.  Their questions have been answered.  See after visit summary for patient specific instructions.  Windell Hummingbird PA-C  [facility] at [facility] 02/15/2018 2:44 PM  Please note: Portions of this report may have been transcribed using dragon voice recognition software. Every effort was made to ensure accuracy; however, inadvertent computerized transcription errors may be present.

## 2018-02-15 NOTE — Patient Instructions (Addendum)
[facility] - [address], [place], [place] [address] Phone: [phone]     IF you received an x-ray today, you will receive an invoice from [facility]. Please contact [facility] at [phone] with questions or concerns regarding your invoice.   IF you received labwork today, you will receive an invoice from [facility]. Please contact [facility] at [phone] with questions or concerns regarding your invoice.   Our billing staff will not be able to assist you with questions regarding bills from these companies.  You will be contacted with the lab results as soon as they are available. The fastest way to get your results is to activate your My Chart account. Instructions are located on the last page of this paperwork. If you have not heard from us regarding the results in 2 weeks, please contact this office.     

## 2018-02-16 LAB — CMP14+EGFR
ALBUMIN: 4.1 g/dL (ref 3.5–5.5)
ALT: 16 IU/L (ref 0–44)
AST: 13 IU/L (ref 0–40)
Albumin/Globulin Ratio: 1.6 (ref 1.2–2.2)
Alkaline Phosphatase: 96 IU/L (ref 39–117)
BUN / CREAT RATIO: 9 (ref 9–20)
BUN: 8 mg/dL (ref 6–20)
Bilirubin Total: 0.7 mg/dL (ref 0.0–1.2)
CALCIUM: 9.6 mg/dL (ref 8.7–10.2)
CO2: 25 mmol/L (ref 20–29)
CREATININE: 0.91 mg/dL (ref 0.76–1.27)
Chloride: 100 mmol/L (ref 96–106)
GFR calc Af Amer: 123 mL/min/{1.73_m2} (ref >59)
GFR, EST NON AFRICAN AMERICAN: 107 mL/min/{1.73_m2} (ref >59)
GLOBULIN, TOTAL: 2.6 g/dL (ref 1.5–4.5)
Glucose: 248 mg/dL — ABNORMAL HIGH (ref 65–99)
Potassium: 4.4 mmol/L (ref 3.5–5.2)
SODIUM: 139 mmol/L (ref 134–144)
Total Protein: 6.7 g/dL (ref 6.0–8.5)

## 2018-02-16 LAB — LIPID PANEL
CHOL/HDL RATIO: 4.2 ratio (ref 0.0–5.0)
Cholesterol, Total: 179 mg/dL (ref 100–199)
HDL: 43 mg/dL (ref >39)
LDL CALC: 124 mg/dL — AB (ref 0–99)
Triglycerides: 62 mg/dL (ref 0–149)
VLDL CHOLESTEROL CAL: 12 mg/dL (ref 5–40)

## 2018-02-16 LAB — HEMOGLOBIN A1C
Est. average glucose Bld gHb Est-mCnc: 255 mg/dL
Hgb A1c MFr Bld: 10.5 % — ABNORMAL HIGH (ref 4.8–5.6)

## 2018-02-17 DIAGNOSIS — E7800 Pure hypercholesterolemia, unspecified: Secondary | ICD-10-CM | POA: Insufficient documentation

## 2018-02-17 MED ORDER — SITAGLIPTIN PHOSPHATE 100 MG PO TABS: 100.0000 mg | ORAL_TABLET | Freq: Every day | ORAL | 1 refills | Status: DC

## 2018-02-17 MED ORDER — ROSUVASTATIN CALCIUM 20 MG PO TABS: 20.0000 mg | ORAL_TABLET | Freq: Every day | ORAL | 3 refills | Status: AC

## 2018-02-17 NOTE — Addendum Note (Signed)
Addended by: Morrell Riddle on: 02/17/2018 07:56 AM   Modules accepted: Orders

## 2018-02-27 ENCOUNTER — Other Ambulatory Visit: Payer: Self-pay | Admitting: Physician Assistant

## 2018-02-27 DIAGNOSIS — M25532 Pain in left wrist: Secondary | ICD-10-CM

## 2018-03-04 ENCOUNTER — Telehealth: Payer: Self-pay | Admitting: Physician Assistant

## 2018-03-04 NOTE — Telephone Encounter (Signed)
Copied from CRM [id number]. Topic: Quick Communication - Rx Refill/Question >> Mar 04, 2018 11:22 AM Burchel, Abbi R wrote: Medication: sitaGLIPtin (JANUVIA) 100 MG tablet   Rosey Bath states request for  PA was faxed on 02/17/18.  She wouldlike to check the status, please advise.  [id number] [place] [place] - [address]  [address] [place] [address]  Phone: [phone] Fax: [phone]     Rosey Bath [phone]

## 2018-03-07 MED ORDER — SAXAGLIPTIN HCL 5 MG PO TABS: 5.0000 mg | ORAL_TABLET | Freq: Every day | ORAL | 0 refills | Status: DC

## 2018-03-07 NOTE — Telephone Encounter (Signed)
Please see note below. 

## 2018-03-09 NOTE — Telephone Encounter (Signed)
[id number] key Pa started

## 2018-03-17 NOTE — Telephone Encounter (Signed)
Januvia denied In box

## 2018-03-21 NOTE — Telephone Encounter (Signed)
This had already been switchd to [employer]

## 2018-04-19 ENCOUNTER — Telehealth: Payer: Self-pay | Admitting: Family Medicine

## 2018-04-19 NOTE — Telephone Encounter (Signed)
Patient was denied their medication Januvia by their insurance company. Prescriber was Benny Lennert. Denial letter placed in Dr. Adela Glimpse box at nurses station at 102.

## 2018-04-20 NOTE — Telephone Encounter (Signed)
Please schedule patient to establish care as patient's previous pcp in longer with this practice. thanks 

## 2018-04-22 ENCOUNTER — Telehealth: Payer: Self-pay | Admitting: Family Medicine

## 2018-04-22 NOTE — Telephone Encounter (Signed)
Called and spoke with patient regarding their RX refill. He stated that he does not know yet if he will continue with [facility] or if he will seek another PCP. He will call and set up an appt to establish care if he decides to stay here.

## 2018-10-03 ENCOUNTER — Emergency Department (HOSPITAL_COMMUNITY): Payer: 59

## 2018-10-03 ENCOUNTER — Encounter (HOSPITAL_COMMUNITY): Payer: Self-pay

## 2018-10-03 ENCOUNTER — Other Ambulatory Visit: Payer: Self-pay

## 2018-10-03 ENCOUNTER — Emergency Department (HOSPITAL_COMMUNITY)
Admission: EM | Admit: 2018-10-03 | Discharge: 2018-10-03 | Disposition: A | Discharge status: Home / Self Care | Payer: 59 | Attending: Emergency Medicine | Admitting: Emergency Medicine

## 2018-10-03 DIAGNOSIS — I10 Essential (primary) hypertension: Secondary | ICD-10-CM | POA: Diagnosis not present

## 2018-10-03 DIAGNOSIS — R0789 Other chest pain: Secondary | ICD-10-CM | POA: Insufficient documentation

## 2018-10-03 DIAGNOSIS — E119 Type 2 diabetes mellitus without complications: Secondary | ICD-10-CM | POA: Diagnosis not present

## 2018-10-03 DIAGNOSIS — R079 Chest pain, unspecified: Secondary | ICD-10-CM | POA: Diagnosis present

## 2018-10-03 DIAGNOSIS — J00 Acute nasopharyngitis [common cold]: Secondary | ICD-10-CM | POA: Diagnosis not present

## 2018-10-03 DIAGNOSIS — Z79899 Other long term (current) drug therapy: Secondary | ICD-10-CM | POA: Diagnosis not present

## 2018-10-03 DIAGNOSIS — Z7982 Long term (current) use of aspirin: Secondary | ICD-10-CM | POA: Insufficient documentation

## 2018-10-03 LAB — BASIC METABOLIC PANEL
Anion gap: 8 (ref 5–15)
BUN: 12 mg/dL (ref 6–20)
CO2: 28 mmol/L (ref 22–32)
Calcium: 9.5 mg/dL (ref 8.9–10.3)
Chloride: 102 mmol/L (ref 98–111)
Creatinine, Ser: 1.04 mg/dL (ref 0.61–1.24)
GFR calc Af Amer: >60 mL/min (ref >60)
Glucose, Bld: 114 mg/dL — ABNORMAL HIGH (ref 70–99)
Potassium: 4 mmol/L (ref 3.5–5.1)
Sodium: 138 mmol/L (ref 135–145)

## 2018-10-03 LAB — POCT I-STAT TROPONIN I: Troponin i, poc: 0 ng/mL (ref 0.00–0.08)

## 2018-10-03 LAB — CBC
HCT: 50.3 % (ref 39.0–52.0)
Hemoglobin: 15.9 g/dL (ref 13.0–17.0)
MCH: 29 pg (ref 26.0–34.0)
MCHC: 31.6 g/dL (ref 30.0–36.0)
MCV: 91.8 fL (ref 80.0–100.0)
Platelets: 269 10*3/uL (ref 150–400)
RBC: 5.48 MIL/uL (ref 4.22–5.81)
RDW: 12.9 % (ref 11.5–15.5)
WBC: 7.2 10*3/uL (ref 4.0–10.5)
nRBC: 0 % (ref 0.0–0.2)

## 2018-10-03 LAB — GLUCOSE, CAPILLARY: Glucose-Capillary: 71 mg/dL (ref 70–99)

## 2018-10-03 NOTE — Discharge Instructions (Addendum)
You may take over-the-counter medicine for symptomatic relief, such as Tylenol, Coricidin HBP, black elderberry, etc. Please limit acetaminophen (Tylenol) to 4000 mg and Ibuprofen (Motrin, Advil, etc.) to 2400 mg for a 24hr period. Please note that other over-the-counter medicine may contain acetaminophen or ibuprofen as a component of their ingredients.

## 2018-10-03 NOTE — ED Provider Notes (Signed)
[facility]-EMERGENCY DEPT Provider Note  CSN: [id number] Arrival date & time: 10/03/18 2004  Chief Complaint(s) Chest Pain  HPI Sean Rogers is a 38 y.o. male   The history is provided by the patient.  Chest Pain  Chest pain location: left upper. Pain quality: stabbing   Pain radiates to:  Does not radiate Pain severity:  Severe Onset quality:  Sudden Duration: 2-3 seconds. Timing:  Intermittent (for several days) Progression:  Waxing and waning Chronicity:  New Relieved by:  Nothing Worsened by:  Nothing Associated symptoms: cough   Associated symptoms: no fever, no nausea, no shortness of breath and no vomiting   Risk factors: diabetes mellitus, high cholesterol and hypertension    Patient reports he had URI symptoms that started Saturday.  Has been resting in bed most of the weekend.  Past Medical History Past Medical History:  Diagnosis Date  . Diabetes mellitus   . Hyperlipidemia   . Hypertension   . Nonischemic cardiomyopathy (HCC) February 2014   EF 35% by stress echo with no ischemic findings.-  Followup echo EF 35-40%  . Obesity    Patient Active Problem List   Diagnosis Date Noted  . Elevated LDL cholesterol level 02/17/2018  . Headache(784.0) 08/31/2013  . Nonischemic cardiomyopathy - lock to be viral; EF 35-40% 08/30/2013    Class: Diagnosis of  . Controlled type 2 diabetes mellitus without complication, without long-term current use of insulin (HCC) 08/05/2011  . Obesity, Class II, BMI 35-39.9 08/05/2011  . Hypertension 08/05/2011  . Hyperlipidemia 08/05/2011   Home Medication(s) Prior to Admission medications   Medication Sig Start Date End Date Taking? Authorizing Provider  amoxicillin (AMOXIL) 500 MG capsule Take 500 mg by mouth daily.   Yes [provider]  aspirin EC 81 MG EC tablet Take 1 tablet (81 mg total) by mouth daily. 09/01/13  Yes Regalado, Belkys A, MD  carvedilol (COREG) 3.125 MG tablet Take 1 tablet  (3.125 mg total) by mouth 2 (two) times daily with a meal. 02/15/18  Yes Weber, Sarah L, PA-C  empagliflozin (JARDIANCE) 10 MG TABS tablet Take 10 mg by mouth daily.   Yes [provider]  glucose blood test strip Use as directed once a day.  DX: E11.65 03/04/16  Yes Weber, Sarah L, PA-C  lisinopril (PRINIVIL,ZESTRIL) 20 MG tablet TAKE 1 TABLET BY MOUTH EVERY DAY Patient taking differently: Take 20 mg by mouth daily.  02/15/18  Yes Weber, Dema Severin, PA-C  metFORMIN (GLUCOPHAGE) 1000 MG tablet Take 1 tablet (1,000 mg total) by mouth 2 (two) times daily with a meal. 02/15/18  Yes Weber, Sarah L, PA-C  rosuvastatin (CRESTOR) 20 MG tablet Take 1 tablet (20 mg total) by mouth daily. 02/17/18  Yes Weber, Sarah L, PA-C  saxagliptin HCl (ONGLYZA) 5 MG TABS tablet Take 1 tablet (5 mg total) by mouth daily. 03/07/18  Yes Weber, Dema Severin, PA-C  canagliflozin (INVOKANA) 100 MG TABS tablet Take 1 tablet (100 mg total) by mouth daily. Patient not taking: Reported on 10/03/2018 02/15/18   Morrell Riddle, PA-C  meloxicam (MOBIC) 7.5 MG tablet Take 1-2 tablets (7.5-15 mg total) by mouth daily. Patient not taking: Reported on 10/03/2018 02/15/18   Morrell Riddle, PA-C  Past Surgical History Past Surgical History:  Procedure Laterality Date  . CARDIAC CATHETERIZATION  08/31/2013   Angiographically normal coronary arteries with EF 35-40%  . Echocardiogram Stress Test  08/30/2013   Bruce protocol for 9 minutes/10 METS; no EKG changes. No echo evidence for ischemia.  Global hypokinesis with EF roughly 35% improved to 60% with stress. --> Findings consistent with nonischemic cardiomyopathy  . HIP PINNING     high school football injury (left hip)  . LEFT HEART CATHETERIZATION WITH CORONARY ANGIOGRAM N/A 08/31/2013   Procedure: LEFT HEART CATHETERIZATION WITH CORONARY ANGIOGRAM;  Surgeon: Lennette Bihari, MD;  Location: [facility] CATH LAB;  Service: Cardiovascular;  Laterality: N/A;  . TRANSTHORACIC ECHOCARDIOGRAM  10/23/2013   EF 35-40% with global hypokinesis.   Family History Family History  Problem Relation Age of Onset  . Diabetes Mother   . Hypertension Mother   . Hyperlipidemia Mother   . Diabetes Maternal Uncle   . Cancer Maternal Grandmother   . Diabetes Sister     Social History Social History   Tobacco Use  . Smoking status: Never Smoker  . Smokeless tobacco: Never Used  Substance Use Topics  . Alcohol use: No  . Drug use: No   Allergies Patient has no known allergies.  Review of Systems Review of Systems  Constitutional: Negative for fever.  HENT: Positive for congestion, postnasal drip and rhinorrhea.   Respiratory: Positive for cough. Negative for shortness of breath.   Cardiovascular: Positive for chest pain.  Gastrointestinal: Negative for nausea and vomiting.   All other systems are reviewed and are negative for acute change except as noted in the HPI  Physical Exam Vital Signs  I have reviewed the triage vital signs BP (!) 158/109 (BP Location: Right Arm)   Pulse 85   Temp 98.4 F (36.9 C) (Oral)   Resp 17   Ht 5\' 9"  (1.753 m)   Wt 117.9 kg   SpO2 99%   BMI 38.40 kg/m   Physical Exam Vitals signs reviewed.  Constitutional:      General: He is not in acute distress.    Appearance: He is well-developed. He is not diaphoretic.  HENT:     Head: Normocephalic and atraumatic.     Nose: Nose normal.  Eyes:     General: No scleral icterus.       Right eye: No discharge.        Left eye: No discharge.     Conjunctiva/sclera: Conjunctivae normal.     Pupils: Pupils are equal, round, and reactive to light.  Neck:     Musculoskeletal: Normal range of motion and neck supple.  Cardiovascular:     Rate and Rhythm: Normal rate and regular rhythm.     Heart sounds: No murmur. No friction rub. No gallop.   Pulmonary:     Effort: Pulmonary effort  is normal. No respiratory distress.     Breath sounds: Normal breath sounds. No stridor. No rales.  Abdominal:     General: There is no distension.     Palpations: Abdomen is soft.     Tenderness: There is no abdominal tenderness.  Musculoskeletal:        General: No tenderness.  Skin:    General: Skin is warm and dry.     Findings: No erythema or rash.  Neurological:     Mental Status: He is alert and oriented to person, place, and time.     ED Results and Treatments Labs (all  labs ordered are listed, but only abnormal results are displayed) Labs Reviewed  BASIC METABOLIC PANEL - Abnormal; Notable for the following components:      Result Value   Glucose, Bld 114 (*)    All other components within normal limits  CBC  GLUCOSE, CAPILLARY  I-STAT TROPONIN, ED  POCT I-STAT TROPONIN I                                                                                                                         EKG  EKG Interpretation  Date/Time:  Monday October 03 2018 20:16:54 EDT Ventricular Rate:  85 PR Interval:    QRS Duration: 84 QT Interval:  368 QTC Calculation: 438 R Axis:   12 Text Interpretation:  Sinus rhythm NO STEMI. Confirmed by Drema Pry [phone]) on 10/03/2018 11:27:46 PM      Radiology Dg Chest 2 View  Result Date: 10/03/2018 CLINICAL DATA:  Left-sided chest pain. EXAM: CHEST - 2 VIEW COMPARISON:  August 29, 2013 FINDINGS: The heart size and mediastinal contours are within normal limits. Both lungs are clear. The visualized skeletal structures are unremarkable. IMPRESSION: No active cardiopulmonary disease. Electronically Signed   By: Gerome Sam III M.D   On: 10/03/2018 20:30   Pertinent labs & imaging results that were available during my care of the patient were reviewed by me and considered in my medical decision making (see chart for details).  Medications Ordered in ED Medications - No data to display                                                                                                                                   Procedures Procedures  (including critical care time)  Medical Decision Making / ED Course I have reviewed the nursing notes for this encounter and the patient's prior records (if available in EHR or on provided paperwork).    Highly atypical chest pain inconsistent with ACS.  EKG without acute ischemic changes or evidence of pericarditis.  Troponin drawn in triage negative.  Do not feel this is ACS related and there is no need for additional cardiac markers at this time.  Low suspicion for pulmonary embolism.  Not classic for aortic dissection or esophageal perforation.  Chest x-ray without evidence suggestive of pneumonia, pneumothorax, pneumomediastinum.  No abnormal contour of the mediastinum to suggest dissection. No evidence of acute injuries.  Symptoms appear  to be chest wall related.   The patient appears reasonably screened and/or stabilized for discharge and I doubt any other medical condition or other [facility] requiring further screening, evaluation, or treatment in the ED at this time prior to discharge.  The patient is safe for discharge with strict return precautions.   Final Clinical Impression(s) / ED Diagnoses Final diagnoses:  Stabbing chest pain  Acute nasopharyngitis    Disposition: Discharge  Condition: Good  I have discussed the results, Dx and Tx plan with the patient who expressed understanding and agree(s) with the plan. Discharge instructions discussed at great length. The patient was given strict return precautions who verbalized understanding of the instructions. No further questions at time of discharge.    ED Discharge Orders    None       Follow Up: Primary care provider  Schedule an appointment as soon as possible for a visit  in 5-7 days, If symptoms do not improve or  worsen     This chart was dictated using voice recognition software.  Despite best efforts to  proofread,  errors can occur which can change the documentation meaning.   Nira Conn, MD 10/04/18 [phone]

## 2018-10-03 NOTE — ED Triage Notes (Signed)
Pt coming from home c/o intermittent left sided chest pain that started Saturday. Sharp and nonradiating. Pt has been fasting for the last two days and has not been taking bp meds. Pt c/o chills, fatigue and headache that started after fasting as well. Pt currently not in pain.

## 2018-10-03 NOTE — ED Notes (Signed)
Patient transported to X-ray 

## 2019-03-14 ENCOUNTER — Other Ambulatory Visit: Payer: Self-pay

## 2019-03-14 DIAGNOSIS — Z20822 Contact with and (suspected) exposure to covid-19: Secondary | ICD-10-CM

## 2019-03-15 LAB — NOVEL CORONAVIRUS, NAA: SARS-CoV-2, NAA: NOT DETECTED

## 2019-03-16 ENCOUNTER — Telehealth: Payer: Self-pay | Admitting: General Practice

## 2019-03-16 ENCOUNTER — Telehealth: Payer: Self-pay | Admitting: Physician Assistant

## 2019-03-16 NOTE — Telephone Encounter (Signed)
Patient called for his Covid-19 test results.  He was told that Covid was Not Detected. °

## 2019-03-16 NOTE — Telephone Encounter (Signed)
Negative COVID results given. Patient results "NOT Detected." Caller expressed understanding. ° °

## 2019-05-09 ENCOUNTER — Other Ambulatory Visit: Payer: Self-pay

## 2019-05-09 DIAGNOSIS — Z20822 Contact with and (suspected) exposure to covid-19: Secondary | ICD-10-CM

## 2019-05-11 LAB — NOVEL CORONAVIRUS, NAA: SARS-CoV-2, NAA: NOT DETECTED

## 2019-06-20 ENCOUNTER — Other Ambulatory Visit: Payer: Self-pay

## 2019-06-20 DIAGNOSIS — Z20822 Contact with and (suspected) exposure to covid-19: Secondary | ICD-10-CM

## 2019-06-22 LAB — NOVEL CORONAVIRUS, NAA: SARS-CoV-2, NAA: NOT DETECTED

## 2019-11-06 ENCOUNTER — Other Ambulatory Visit: Payer: Self-pay

## 2019-11-06 ENCOUNTER — Encounter (HOSPITAL_BASED_OUTPATIENT_CLINIC_OR_DEPARTMENT_OTHER): Payer: Self-pay | Admitting: *Deleted

## 2019-11-06 ENCOUNTER — Emergency Department (HOSPITAL_BASED_OUTPATIENT_CLINIC_OR_DEPARTMENT_OTHER)
Admission: EM | Admit: 2019-11-06 | Discharge: 2019-11-07 | Disposition: A | Discharge status: Home / Self Care | Payer: BLUE CROSS/BLUE SHIELD | Attending: Emergency Medicine | Admitting: Emergency Medicine

## 2019-11-06 ENCOUNTER — Emergency Department (HOSPITAL_BASED_OUTPATIENT_CLINIC_OR_DEPARTMENT_OTHER): Payer: BLUE CROSS/BLUE SHIELD

## 2019-11-06 DIAGNOSIS — R0789 Other chest pain: Secondary | ICD-10-CM | POA: Diagnosis not present

## 2019-11-06 DIAGNOSIS — I10 Essential (primary) hypertension: Secondary | ICD-10-CM | POA: Insufficient documentation

## 2019-11-06 DIAGNOSIS — Z7984 Long term (current) use of oral hypoglycemic drugs: Secondary | ICD-10-CM | POA: Diagnosis not present

## 2019-11-06 DIAGNOSIS — R079 Chest pain, unspecified: Secondary | ICD-10-CM | POA: Diagnosis present

## 2019-11-06 DIAGNOSIS — Z79899 Other long term (current) drug therapy: Secondary | ICD-10-CM | POA: Insufficient documentation

## 2019-11-06 DIAGNOSIS — Z7982 Long term (current) use of aspirin: Secondary | ICD-10-CM | POA: Diagnosis not present

## 2019-11-06 DIAGNOSIS — E119 Type 2 diabetes mellitus without complications: Secondary | ICD-10-CM | POA: Insufficient documentation

## 2019-11-06 LAB — COMPREHENSIVE METABOLIC PANEL
ALT: 19 U/L (ref 0–44)
AST: 17 U/L (ref 15–41)
Albumin: 4.1 g/dL (ref 3.5–5.0)
Alkaline Phosphatase: 78 U/L (ref 38–126)
Anion gap: 10 (ref 5–15)
BUN: 18 mg/dL (ref 6–20)
CO2: 26 mmol/L (ref 22–32)
Calcium: 9.8 mg/dL (ref 8.9–10.3)
Chloride: 103 mmol/L (ref 98–111)
Creatinine, Ser: 1.02 mg/dL (ref 0.61–1.24)
GFR calc Af Amer: >60 mL/min (ref >60)
GFR calc non Af Amer: >60 mL/min (ref >60)
Glucose, Bld: 195 mg/dL — ABNORMAL HIGH (ref 70–99)
Potassium: 4.4 mmol/L (ref 3.5–5.1)
Sodium: 139 mmol/L (ref 135–145)
Total Bilirubin: 0.5 mg/dL (ref 0.3–1.2)
Total Protein: 7.9 g/dL (ref 6.5–8.1)

## 2019-11-06 LAB — CBC WITH DIFFERENTIAL/PLATELET
Abs Immature Granulocytes: 0.03 10*3/uL (ref 0.00–0.07)
Basophils Absolute: 0 10*3/uL (ref 0.0–0.1)
Basophils Relative: 0 %
Eosinophils Absolute: 0.1 10*3/uL (ref 0.0–0.5)
Eosinophils Relative: 1 %
HCT: 45.8 % (ref 39.0–52.0)
Hemoglobin: 15 g/dL (ref 13.0–17.0)
Immature Granulocytes: 0 %
Lymphocytes Relative: 32 %
Lymphs Abs: 2.5 10*3/uL (ref 0.7–4.0)
MCH: 29.2 pg (ref 26.0–34.0)
MCHC: 32.8 g/dL (ref 30.0–36.0)
MCV: 89.3 fL (ref 80.0–100.0)
Monocytes Absolute: 0.4 10*3/uL (ref 0.1–1.0)
Monocytes Relative: 5 %
Neutro Abs: 4.9 10*3/uL (ref 1.7–7.7)
Neutrophils Relative %: 62 %
Platelets: 256 10*3/uL (ref 150–400)
RBC: 5.13 MIL/uL (ref 4.22–5.81)
RDW: 13.2 % (ref 11.5–15.5)
WBC: 7.9 10*3/uL (ref 4.0–10.5)
nRBC: 0 % (ref 0.0–0.2)

## 2019-11-06 LAB — TROPONIN I (HIGH SENSITIVITY): Troponin I (High Sensitivity): 2 ng/L (ref <18)

## 2019-11-06 NOTE — ED Provider Notes (Signed)
[facility] Provider Note: Lowella Dell, MD, FACEP  CSN: [id number] MRN: [id number] ARRIVAL: 11/06/19 at 2139 ROOM: [facility]07/[facility]07   CHIEF COMPLAINT  Chest Pain   HISTORY OF PRESENT ILLNESS  11/06/19 11:36 PM Sean Rogers is a 39 y.o. male who has been having chest pain for over 12 hours.  He describes the pain as sharp and fleeting lasting seconds to a minute.  It is located in the left upper chest.  Nothing makes it better or worse except it is brought on with certain movements.  He has had no associated shortness of breath, nausea or diaphoresis.  He has had no pain or swelling in his legs.  He rates his pain as a 2 out of 10 when it occurs.   Past Medical History:  Diagnosis Date  . Diabetes mellitus   . Hyperlipidemia   . Hypertension   . Nonischemic cardiomyopathy (HCC) February 2014   EF 35% by stress echo with no ischemic findings.-  Followup echo EF 35-40%  . Obesity     Past Surgical History:  Procedure Laterality Date  . CARDIAC CATHETERIZATION  08/31/2013   Angiographically normal coronary arteries with EF 35-40%  . Echocardiogram Stress Test  08/30/2013   Bruce protocol for 9 minutes/10 METS; no EKG changes. No echo evidence for ischemia.  Global hypokinesis with EF roughly 35% improved to 60% with stress. --> Findings consistent with nonischemic cardiomyopathy  . HIP PINNING     high school football injury (left hip)  . LEFT HEART CATHETERIZATION WITH CORONARY ANGIOGRAM N/A 08/31/2013   Procedure: LEFT HEART CATHETERIZATION WITH CORONARY ANGIOGRAM;  Surgeon: Lennette Bihari, MD;  Location: [facility] CATH LAB;  Service: Cardiovascular;  Laterality: N/A;  . TRANSTHORACIC ECHOCARDIOGRAM  10/23/2013   EF 35-40% with global hypokinesis.    Family History  Problem Relation Age of Onset  . Diabetes Mother   . Hypertension Mother   . Hyperlipidemia Mother   . Diabetes Maternal Uncle   . Cancer Maternal Grandmother   . Diabetes Sister     Social History    Tobacco Use  . Smoking status: Never Smoker  . Smokeless tobacco: Never Used  Substance Use Topics  . Alcohol use: No  . Drug use: No    Prior to Admission medications   Medication Sig Start Date End Date Taking? Authorizing Provider  aspirin EC 81 MG EC tablet Take 1 tablet (81 mg total) by mouth daily. 09/01/13  Yes Regalado, Belkys A, MD  canagliflozin (INVOKANA) 100 MG TABS tablet Take 1 tablet (100 mg total) by mouth daily. 02/15/18  Yes Weber, Sarah L, PA-C  carvedilol (COREG) 3.125 MG tablet Take 1 tablet (3.125 mg total) by mouth 2 (two) times daily with a meal. 02/15/18  Yes Weber, Sarah L, PA-C  empagliflozin (JARDIANCE) 10 MG TABS tablet Take 10 mg by mouth daily.   Yes [provider]  glucose blood test strip Use as directed once a day.  DX: E11.65 03/04/16  Yes Weber, Sarah L, PA-C  lisinopril (PRINIVIL,ZESTRIL) 20 MG tablet TAKE 1 TABLET BY MOUTH EVERY DAY Patient taking differently: Take 20 mg by mouth daily.  02/15/18  Yes Weber, Dema Severin, PA-C  metFORMIN (GLUCOPHAGE) 1000 MG tablet Take 1 tablet (1,000 mg total) by mouth 2 (two) times daily with a meal. 02/15/18  Yes Weber, Sarah L, PA-C  rosuvastatin (CRESTOR) 20 MG tablet Take 1 tablet (20 mg total) by mouth daily. 02/17/18  Yes Weber, Dema Severin, PA-C  saxagliptin HCl (ONGLYZA) 5 MG TABS tablet Take 1 tablet (5 mg total) by mouth daily. 03/07/18 11/06/19  Gale Journey, Damaris Hippo, PA-C    Allergies Patient has no known allergies.   REVIEW OF SYSTEMS  Negative except as noted here or in the History of Present Illness.   PHYSICAL EXAMINATION  Initial Vital Signs Blood pressure 131/84, pulse 80, temperature 98.7 F (37.1 C), temperature source Oral, resp. rate 17, height 5\' 9"  (1.753 m), weight 115.7 kg, SpO2 98 %.  Examination General: Well-developed, well-nourished male in no acute distress; appearance consistent with age of record HENT: normocephalic; atraumatic Eyes: pupils equal, round and reactive to light;  extraocular muscles intact Neck: supple Heart: regular rate and rhythm; no murmur Lungs: clear to auscultation bilaterally Chest: Nontender Abdomen: soft; nondistended; nontender; bowel sounds present Extremities: No deformity; full range of motion; pulses normal; no edema Neurologic: Awake, alert and oriented; motor function intact in all extremities and symmetric; no facial droop Skin: Warm and dry Psychiatric: Normal mood and affect   RESULTS  Summary of this visit's results, reviewed and interpreted by myself:   EKG Interpretation  Date/Time:  Monday November 06 2019 21:44:47 EDT Ventricular Rate:  83 PR Interval:  148 QRS Duration: 90 QT Interval:  368 QTC Calculation: 432 R Axis:   24 Text Interpretation: Normal sinus rhythm Normal ECG When compared to priorl no significnat changes seen. No STEMI Confirmed by Antony Blackbird [phone]) on 11/06/2019 10:12:30 PM      Laboratory Studies: Results for orders placed or performed during the hospital encounter of 11/06/19 (from the past 24 hour(s))  CBC with Differential     Status: None   Collection Time: 11/06/19  9:53 PM  Result Value Ref Range   WBC 7.9 4.0 - 10.5 K/uL   RBC 5.13 4.22 - 5.81 MIL/uL   Hemoglobin 15.0 13.0 - 17.0 g/dL   HCT 45.8 39.0 - 52.0 %   MCV 89.3 80.0 - 100.0 fL   MCH 29.2 26.0 - 34.0 pg   MCHC 32.8 30.0 - 36.0 g/dL   RDW 13.2 11.5 - 15.5 %   Platelets 256 150 - 400 K/uL   nRBC 0.0 0.0 - 0.2 %   Neutrophils Relative % 62 %   Neutro Abs 4.9 1.7 - 7.7 K/uL   Lymphocytes Relative 32 %   Lymphs Abs 2.5 0.7 - 4.0 K/uL   Monocytes Relative 5 %   Monocytes Absolute 0.4 0.1 - 1.0 K/uL   Eosinophils Relative 1 %   Eosinophils Absolute 0.1 0.0 - 0.5 K/uL   Basophils Relative 0 %   Basophils Absolute 0.0 0.0 - 0.1 K/uL   Immature Granulocytes 0 %   Abs Immature Granulocytes 0.03 0.00 - 0.07 K/uL  Comprehensive metabolic panel     Status: Abnormal   Collection Time: 11/06/19  9:53 PM  Result Value Ref  Range   Sodium 139 135 - 145 mmol/L   Potassium 4.4 3.5 - 5.1 mmol/L   Chloride 103 98 - 111 mmol/L   CO2 26 22 - 32 mmol/L   Glucose, Bld 195 (H) 70 - 99 mg/dL   BUN 18 6 - 20 mg/dL   Creatinine, Ser 1.02 0.61 - 1.24 mg/dL   Calcium 9.8 8.9 - 10.3 mg/dL   Total Protein 7.9 6.5 - 8.1 g/dL   Albumin 4.1 3.5 - 5.0 g/dL   AST 17 15 - 41 U/L   ALT 19 0 - 44 U/L   Alkaline Phosphatase 78 38 -  126 U/L   Total Bilirubin 0.5 0.3 - 1.2 mg/dL   GFR calc non Af Amer >60 >60 mL/min   GFR calc Af Amer >60 >60 mL/min   Anion gap 10 5 - 15  Troponin I (High Sensitivity)     Status: None   Collection Time: 11/06/19  9:53 PM  Result Value Ref Range   Troponin I (High Sensitivity) 2 <18 ng/L   Imaging Studies: DG Chest 2 View  Result Date: 11/06/2019 CLINICAL DATA:  Chest pain EXAM: CHEST - 2 VIEW COMPARISON:  10/03/2018 FINDINGS: The heart size and mediastinal contours are within normal limits. Both lungs are clear. The visualized skeletal structures are unremarkable. IMPRESSION: No active cardiopulmonary disease. Electronically Signed   By: Deatra Robinson M.D.   On: 11/06/2019 22:09    ED COURSE and MDM  Nursing notes, initial and subsequent vitals signs, including pulse oximetry, reviewed and interpreted by myself.  Vitals:   11/06/19 2148 11/06/19 2230 11/06/19 2300 11/06/19 2330  BP: (!) 145/93 (!) 133/92 (!) 135/92 131/84  Pulse:  81 77 80  Resp:  17 17 17   Temp:      TempSrc:      SpO2:  98% 98% 98%  Weight:      Height:       Medications - No data to display  The patient's work-up is normal and his pain is atypical for cardiac etiology despite his history of nonischemic cardiomyopathy.  Per HEART score he is safe for discharge at this time with outpatient follow-up with his PCP.  PROCEDURES  Procedures   ED DIAGNOSES     ICD-10-CM   1. Atypical chest pain  R07.89        10-04-1989, MD 11/06/19 2344

## 2019-11-06 NOTE — ED Triage Notes (Signed)
Pt c/left sided chest pain x 12 hrs , denies SOB or nausea

## 2019-11-06 NOTE — ED Notes (Signed)
Patient transported to X-ray 

## 2020-02-12 IMAGING — CR CHEST - 2 VIEW
2 series · 2 of 2 positions shown · non-contrast
Comparison: August 29, 2013

CLINICAL DATA: Left-sided chest pain.

EXAM:
CHEST - 2 VIEW

[w chest pa]
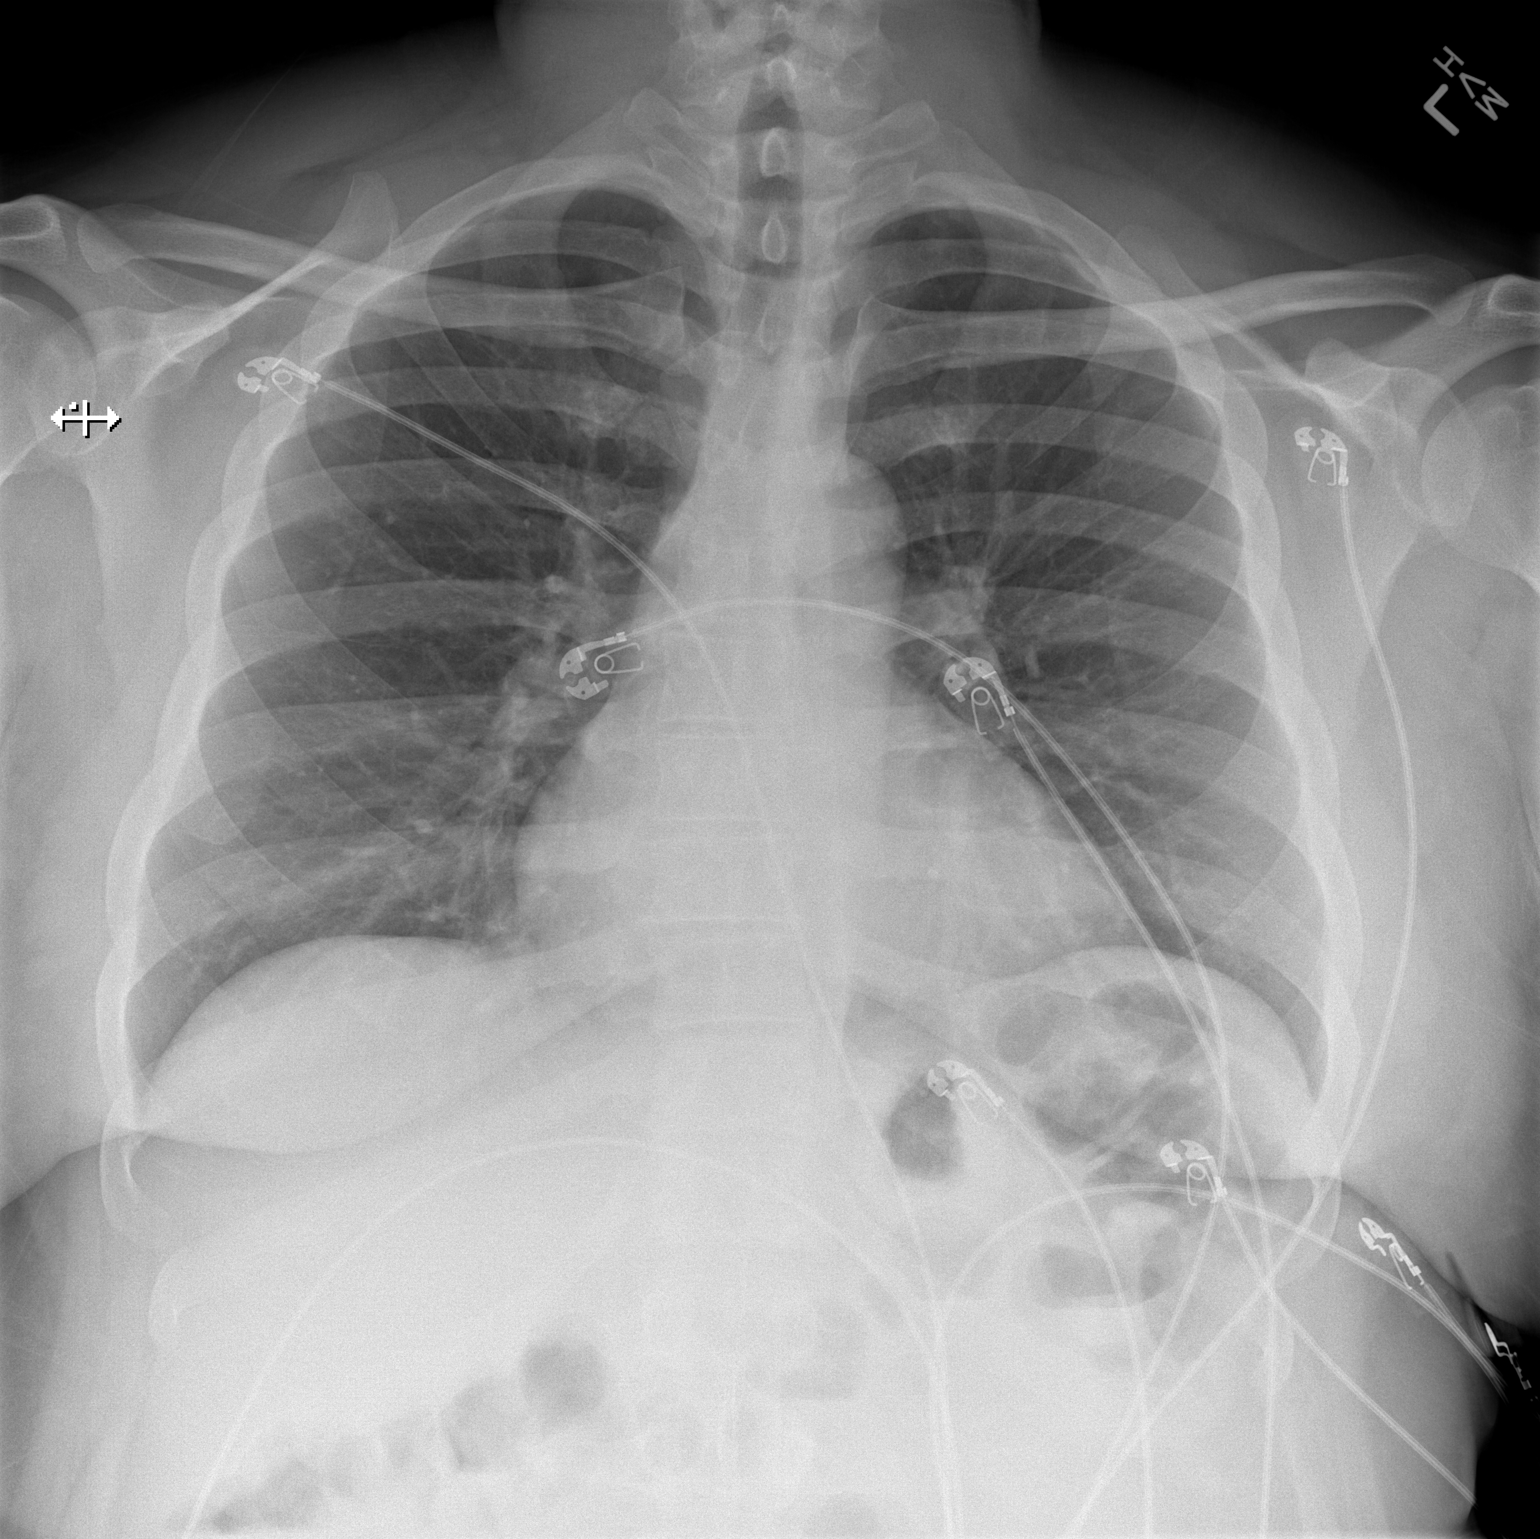

[w chest lat]
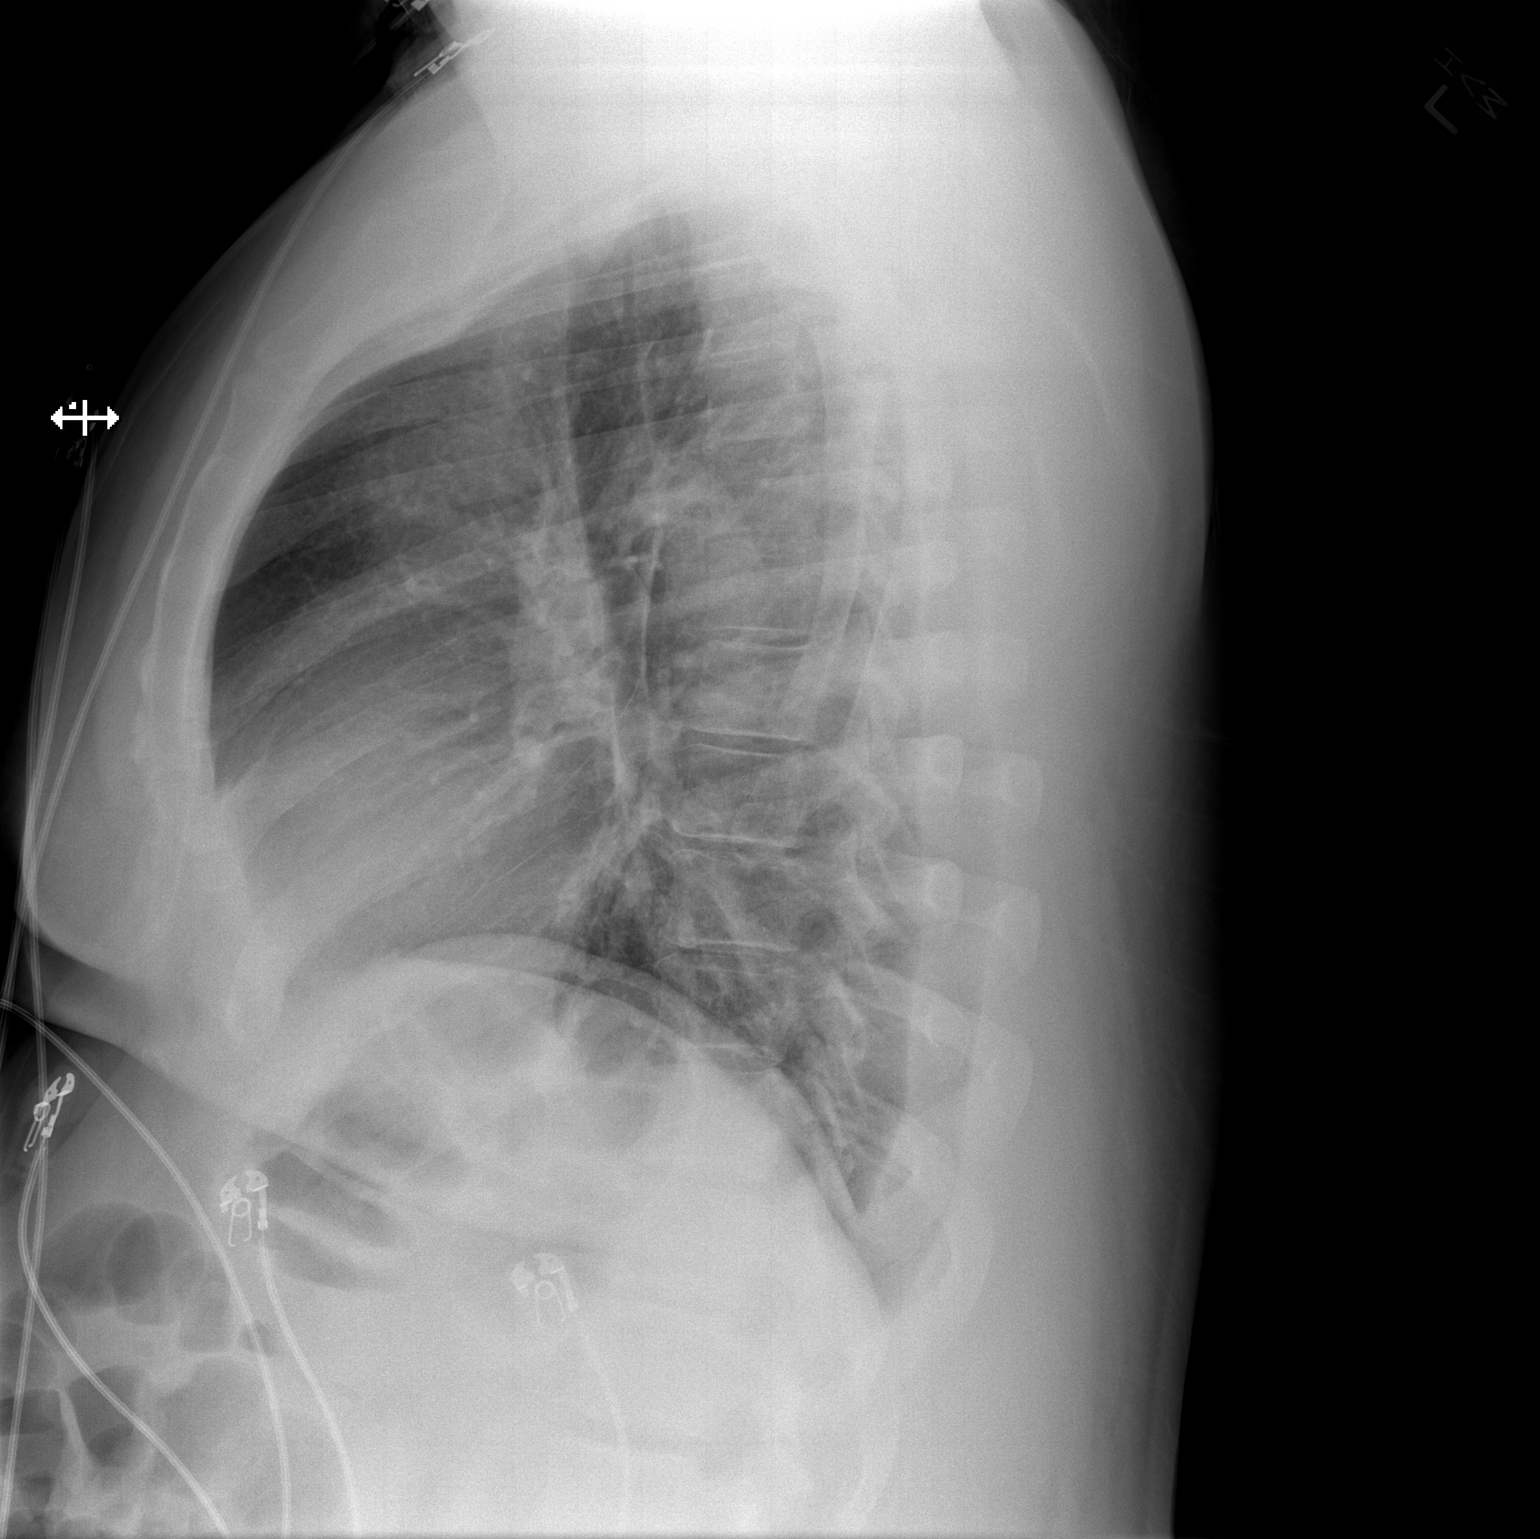

[2 of 2 positions shown; findings below may reference images not displayed]

FINDINGS: The heart size and mediastinal contours are within normal limits.
Both lungs are clear. The visualized skeletal structures are
unremarkable.
IMPRESSION: No active cardiopulmonary disease.

## 2020-04-07 ENCOUNTER — Other Ambulatory Visit: Payer: Self-pay | Admitting: Adult Health

## 2020-04-07 NOTE — Progress Notes (Signed)
I connected by phone with Sean Rogers on 04/07/2020 at 9:02 PM to discuss the potential use of a new treatment for mild to moderate COVID-19 viral infection in non-hospitalized patients.  This patient is a 39 y.o. male that meets the FDA criteria for Emergency Use Authorization of COVID monoclonal antibody casirivimab/imdevimab.  Has a (+) direct SARS-CoV-2 viral test result  Has mild or moderate COVID-19   Is NOT hospitalized due to COVID-19  Is within 10 days of symptom onset  Has at least one of the high risk factor(s) for progression to severe COVID-19 and/or hospitalization as defined in EUA.  Specific high risk criteria : Diabetes   I have spoken and communicated the following to the patient or parent/caregiver regarding COVID monoclonal antibody treatment:  1. FDA has authorized the emergency use for the treatment of mild to moderate COVID-19 in adults and pediatric patients with positive results of direct SARS-CoV-2 viral testing who are 77 years of age and older weighing at least 40 kg, and who are at high risk for progressing to severe COVID-19 and/or hospitalization.  2. The significant known and potential risks and benefits of COVID monoclonal antibody, and the extent to which such potential risks and benefits are unknown.  3. Information on available alternative treatments and the risks and benefits of those alternatives, including clinical trials.  4. Patients treated with COVID monoclonal antibody should continue to self-isolate and use infection control measures (e.g., wear mask, isolate, social distance, avoid sharing personal items, clean and disinfect "high touch" surfaces, and frequent handwashing) according to CDC guidelines.   5. The patient or parent/caregiver has the option to accept or refuse COVID monoclonal antibody treatment.  After reviewing this information with the patient, The patient agreed to proceed with receiving casirivimab\imdevimab infusion and  will be provided a copy of the Fact sheet prior to receiving the infusion.  Scheduled for 04/09/20 at 1130  Will bring copy of test   Sean Rogers 04/07/2020 9:02 PM

## 2020-04-09 ENCOUNTER — Ambulatory Visit (HOSPITAL_COMMUNITY)
Admission: RE | Admit: 2020-04-09 | Discharge: 2020-04-09 | Disposition: A | Discharge status: Home / Self Care | Payer: BLUE CROSS/BLUE SHIELD | Source: Ambulatory Visit | Attending: Pulmonary Disease | Admitting: Pulmonary Disease

## 2020-04-09 ENCOUNTER — Other Ambulatory Visit (HOSPITAL_COMMUNITY): Payer: Self-pay

## 2020-04-09 DIAGNOSIS — U071 COVID-19: Secondary | ICD-10-CM | POA: Insufficient documentation

## 2020-04-09 DIAGNOSIS — E119 Type 2 diabetes mellitus without complications: Secondary | ICD-10-CM | POA: Insufficient documentation

## 2020-04-09 MED ORDER — EPINEPHRINE 0.3 MG/0.3ML IJ SOAJ: 0.3000 mg | Freq: Once | INTRAMUSCULAR | Status: DC | PRN

## 2020-04-09 MED ORDER — DIPHENHYDRAMINE HCL 50 MG/ML IJ SOLN: 50.0000 mg | Freq: Once | INTRAMUSCULAR | Status: DC | PRN

## 2020-04-09 MED ORDER — SODIUM CHLORIDE 0.9 % IV SOLN
1200.0000 mg | Freq: Once | INTRAVENOUS | Status: AC
  Administered 2020-04-09: 1200 mg via INTRAVENOUS
  Filled 2020-04-09: qty 10

## 2020-04-09 MED ORDER — METHYLPREDNISOLONE SODIUM SUCC 125 MG IJ SOLR: 125.0000 mg | Freq: Once | INTRAMUSCULAR | Status: DC | PRN

## 2020-04-09 MED ORDER — FAMOTIDINE IN NACL 20-0.9 MG/50ML-% IV SOLN: 20.0000 mg | Freq: Once | INTRAVENOUS | Status: DC | PRN

## 2020-04-09 MED ORDER — ALBUTEROL SULFATE HFA 108 (90 BASE) MCG/ACT IN AERS: 2.0000 | INHALATION_SPRAY | Freq: Once | RESPIRATORY_TRACT | Status: DC | PRN

## 2020-04-09 MED ORDER — SODIUM CHLORIDE 0.9 % IV SOLN: INTRAVENOUS | Status: DC | PRN

## 2020-04-09 NOTE — Progress Notes (Signed)
  Diagnosis: COVID-19  Physician:Dr Wright  Procedure: [facility] Med: casirivimab\imdevimab infusion - Provided patient with casirivimab\imdevimab fact sheet for patients, parents and caregivers prior to infusion.  Complications: No immediate complications noted.  Discharge: Discharged home   Sean Rogers 04/09/2020  

## 2020-04-09 NOTE — Discharge Instructions (Signed)

## 2021-03-15 ENCOUNTER — Emergency Department (HOSPITAL_BASED_OUTPATIENT_CLINIC_OR_DEPARTMENT_OTHER)
Admission: EM | Admit: 2021-03-15 | Discharge: 2021-03-15 | Disposition: A | Discharge status: Home / Self Care | Payer: 59 | Attending: Emergency Medicine | Admitting: Emergency Medicine

## 2021-03-15 ENCOUNTER — Encounter (HOSPITAL_BASED_OUTPATIENT_CLINIC_OR_DEPARTMENT_OTHER): Payer: Self-pay

## 2021-03-15 ENCOUNTER — Other Ambulatory Visit: Payer: Self-pay

## 2021-03-15 ENCOUNTER — Emergency Department (HOSPITAL_BASED_OUTPATIENT_CLINIC_OR_DEPARTMENT_OTHER): Payer: 59

## 2021-03-15 DIAGNOSIS — Z7982 Long term (current) use of aspirin: Secondary | ICD-10-CM | POA: Insufficient documentation

## 2021-03-15 DIAGNOSIS — R079 Chest pain, unspecified: Secondary | ICD-10-CM | POA: Insufficient documentation

## 2021-03-15 DIAGNOSIS — I10 Essential (primary) hypertension: Secondary | ICD-10-CM | POA: Diagnosis not present

## 2021-03-15 DIAGNOSIS — Z7984 Long term (current) use of oral hypoglycemic drugs: Secondary | ICD-10-CM | POA: Insufficient documentation

## 2021-03-15 DIAGNOSIS — E119 Type 2 diabetes mellitus without complications: Secondary | ICD-10-CM | POA: Diagnosis not present

## 2021-03-15 LAB — CBC WITH DIFFERENTIAL/PLATELET
Abs Immature Granulocytes: 0.04 10*3/uL (ref 0.00–0.07)
Basophils Absolute: 0 10*3/uL (ref 0.0–0.1)
Basophils Relative: 0 %
Eosinophils Absolute: 0 10*3/uL (ref 0.0–0.5)
Eosinophils Relative: 0 %
HCT: 45.1 % (ref 39.0–52.0)
Hemoglobin: 14.8 g/dL (ref 13.0–17.0)
Immature Granulocytes: 0 %
Lymphocytes Relative: 20 %
Lymphs Abs: 1.8 10*3/uL (ref 0.7–4.0)
MCH: 29.2 pg (ref 26.0–34.0)
MCHC: 32.8 g/dL (ref 30.0–36.0)
MCV: 89.1 fL (ref 80.0–100.0)
Monocytes Absolute: 0.5 10*3/uL (ref 0.1–1.0)
Monocytes Relative: 6 %
Neutro Abs: 6.8 10*3/uL (ref 1.7–7.7)
Neutrophils Relative %: 74 %
Platelets: 247 10*3/uL (ref 150–400)
RBC: 5.06 MIL/uL (ref 4.22–5.81)
RDW: 13.3 % (ref 11.5–15.5)
WBC: 9.2 10*3/uL (ref 4.0–10.5)
nRBC: 0 % (ref 0.0–0.2)

## 2021-03-15 LAB — COMPREHENSIVE METABOLIC PANEL
ALT: 13 U/L (ref 0–44)
AST: 14 U/L — ABNORMAL LOW (ref 15–41)
Albumin: 3.8 g/dL (ref 3.5–5.0)
Alkaline Phosphatase: 75 U/L (ref 38–126)
Anion gap: 7 (ref 5–15)
BUN: 13 mg/dL (ref 6–20)
CO2: 28 mmol/L (ref 22–32)
Calcium: 9.2 mg/dL (ref 8.9–10.3)
Chloride: 105 mmol/L (ref 98–111)
Creatinine, Ser: 1.08 mg/dL (ref 0.61–1.24)
GFR, Estimated: >60 mL/min (ref >60)
Glucose, Bld: 131 mg/dL — ABNORMAL HIGH (ref 70–99)
Potassium: 4 mmol/L (ref 3.5–5.1)
Sodium: 140 mmol/L (ref 135–145)
Total Bilirubin: 0.7 mg/dL (ref 0.3–1.2)
Total Protein: 6.6 g/dL (ref 6.5–8.1)

## 2021-03-15 LAB — TROPONIN I (HIGH SENSITIVITY)
Troponin I (High Sensitivity): 3 ng/L (ref <18)
Troponin I (High Sensitivity): 2 ng/L (ref <18)

## 2021-03-15 NOTE — ED Triage Notes (Signed)
He tells me that as he was showering at 0830 today he noted his right face to be somewhat "numb". He states this continues to "come and go" and also now he c/o some waxing and waning left upper arm paresthesias and dysthesias. He is ambulatory and alert and oriented x 4 with clear speech.

## 2021-03-15 NOTE — Discharge Instructions (Addendum)
All the blood work today looks normal.  No evidence of heart attack or pneumonia or anything wrong with your lungs.  You should avoid ibuprofen but you can take Tylenol as needed for the pain or use a muscle rub.  If you start having any signs of swelling in your legs or shortness of breath you should follow-up with your doctor or return to the emergency room.

## 2021-03-15 NOTE — ED Notes (Signed)
Pt dc home ,pt stated understanding of dc instructions.

## 2021-03-15 NOTE — ED Provider Notes (Signed)
[facility] EMERGENCY DEPT Provider Note   CSN: [id number] Arrival date & time: 03/15/21  1208     History Chief Complaint  Patient presents with   Facial numbness    Sean Rogers is a 40 y.o. male.  Patient is a 40 year old male with a history of diabetes, hypertension, nonischemic cardiomyopathy with an EF of 35% in 2015 but a cardiac catheterization in 2015 with normal coronary arteries who is presenting today with complaints of his whole face feeling numb that then led into left-sided chest pain.  Patient reports he was fine yesterday and when he woke up this morning he noticed some intermittent episodes of his entire face feeling numb.  Then approximately 1 hour prior to arrival he started having discomfort in his left chest that went into his arm.  It seems to be worse when he moves his arm and makes the pain in his chest worse.  He has not had any shortness of breath and he has no pleuritic opponent of the pain.  He currently is still having the pain at this moment.  Other than movement nothing else seems to make it better or worse.  He had URI symptoms approximately 2 weeks ago but things have been improving however he is still taking Mucinex to help break up the mucus.  He denies any recent fever, nausea, vomiting or diarrhea.  He has recently run out of 1 or 2 of his diabetic medications but is still taking everything else on his list.  He just need to get to the pharmacy to get a refill.  He reports that his dad had heart attacks but he is not sure how old he was when he had his first heart attack.  No heart disease in his siblings or mother.  He does not smoke use marijuana or drugs.  Denies alcohol use or NSAIDs.  He has been eating and drinking normally       Past Medical History:  Diagnosis Date   Diabetes mellitus    Hyperlipidemia    Hypertension    Nonischemic cardiomyopathy (HCC) February 2014   EF 35% by stress echo with no ischemic findings.-  Followup  echo EF 35-40%   Obesity     Patient Active Problem List   Diagnosis Date Noted   Elevated LDL cholesterol level 02/17/2018   Headache(784.0) 08/31/2013   Nonischemic cardiomyopathy - lock to be viral; EF 35-40% 08/30/2013    Class: Diagnosis of   Controlled type 2 diabetes mellitus without complication, without long-term current use of insulin (HCC) 08/05/2011   Obesity, Class II, BMI 35-39.9 08/05/2011   Hypertension 08/05/2011   Hyperlipidemia 08/05/2011    Past Surgical History:  Procedure Laterality Date   CARDIAC CATHETERIZATION  08/31/2013   Angiographically normal coronary arteries with EF 35-40%   Echocardiogram Stress Test  08/30/2013   Bruce protocol for 9 minutes/10 METS; no EKG changes. No echo evidence for ischemia.  Global hypokinesis with EF roughly 35% improved to 60% with stress. --> Findings consistent with nonischemic cardiomyopathy   HIP PINNING     high school football injury (left hip)   LEFT HEART CATHETERIZATION WITH CORONARY ANGIOGRAM N/A 08/31/2013   Procedure: LEFT HEART CATHETERIZATION WITH CORONARY ANGIOGRAM;  Surgeon: Lennette Bihari, MD;  Location: [facility] CATH LAB;  Service: Cardiovascular;  Laterality: N/A;   TRANSTHORACIC ECHOCARDIOGRAM  10/23/2013   EF 35-40% with global hypokinesis.       Family History  Problem Relation Age of Onset  Diabetes Mother    Hypertension Mother    Hyperlipidemia Mother    Diabetes Maternal Uncle    Cancer Maternal Grandmother    Diabetes Sister     Social History   Tobacco Use   Smoking status: Never   Smokeless tobacco: Never  Vaping Use   Vaping Use: Never used  Substance Use Topics   Alcohol use: No   Drug use: No    Home Medications Prior to Admission medications   Medication Sig Start Date End Date Taking? Authorizing Provider  aspirin EC 81 MG EC tablet Take 1 tablet (81 mg total) by mouth daily. 09/01/13  Yes Regalado, Belkys A, MD  canagliflozin (INVOKANA) 100 MG TABS tablet Take 1 tablet (100  mg total) by mouth daily. 02/15/18  Yes Weber, Sarah L, PA-C  carvedilol (COREG) 3.125 MG tablet Take 1 tablet (3.125 mg total) by mouth 2 (two) times daily with a meal. 02/15/18  Yes Weber, Sarah L, PA-C  empagliflozin (JARDIANCE) 10 MG TABS tablet Take 10 mg by mouth daily.   Yes [provider]  lisinopril (PRINIVIL,ZESTRIL) 20 MG tablet TAKE 1 TABLET BY MOUTH EVERY DAY Patient taking differently: Take 20 mg by mouth daily. 02/15/18  Yes Weber, Dema Severin, PA-C  metFORMIN (GLUCOPHAGE) 1000 MG tablet Take 1 tablet (1,000 mg total) by mouth 2 (two) times daily with a meal. 02/15/18  Yes Weber, Sarah L, PA-C  rosuvastatin (CRESTOR) 20 MG tablet Take 1 tablet (20 mg total) by mouth daily. 02/17/18  Yes Weber, Dema Severin, PA-C  Semaglutide (RYBELSUS PO) Take 100 mg by mouth.   Yes [provider]  glucose blood test strip Use as directed once a day.  DX: E11.65 03/04/16   Valarie Cones, Dema Severin, PA-C  saxagliptin HCl (ONGLYZA) 5 MG TABS tablet Take 1 tablet (5 mg total) by mouth daily. 03/07/18 11/06/19  Valarie Cones, Dema Severin, PA-C    Allergies    Patient has no known allergies.  Review of Systems   Review of Systems  All other systems reviewed and are negative.  Physical Exam Updated Vital Signs BP 125/86   Pulse 82   Temp 98.3 F (36.8 C) (Oral)   Resp (!) 21   SpO2 97%   Physical Exam Vitals and nursing note reviewed.  Constitutional:      General: He is not in acute distress.    Appearance: Normal appearance. He is well-developed.  HENT:     Head: Normocephalic and atraumatic.     Mouth/Throat:     Mouth: Mucous membranes are moist.  Eyes:     Conjunctiva/sclera: Conjunctivae normal.     Pupils: Pupils are equal, round, and reactive to light.  Cardiovascular:     Rate and Rhythm: Normal rate and regular rhythm.     Pulses: Normal pulses.     Heart sounds: No murmur heard. Pulmonary:     Effort: Pulmonary effort is normal. No respiratory distress.     Breath sounds: Normal breath  sounds. No wheezing or rales.  Chest:     Chest wall: Tenderness present.    Abdominal:     General: There is no distension.     Palpations: Abdomen is soft.     Tenderness: There is no abdominal tenderness. There is no guarding or rebound.  Musculoskeletal:        General: No tenderness. Normal range of motion.     Cervical back: Normal range of motion and neck supple.     Right lower  leg: No edema.     Left lower leg: No edema.  Skin:    General: Skin is warm and dry.     Findings: No erythema or rash.  Neurological:     Mental Status: He is alert and oriented to person, place, and time. Mental status is at baseline.     Sensory: No sensory deficit.     Motor: No weakness.     Coordination: Coordination normal.     Gait: Gait normal.  Psychiatric:        Mood and Affect: Mood normal.        Behavior: Behavior normal.    ED Results / Procedures / Treatments   Labs (all labs ordered are listed, but only abnormal results are displayed) Labs Reviewed  COMPREHENSIVE METABOLIC PANEL - Abnormal; Notable for the following components:      Result Value   Glucose, Bld 131 (*)    AST 14 (*)    All other components within normal limits  CBC WITH DIFFERENTIAL/PLATELET  TROPONIN I (HIGH SENSITIVITY)  TROPONIN I (HIGH SENSITIVITY)    EKG EKG Interpretation  Date/Time:  Saturday March 15 2021 12:31:18 EDT Ventricular Rate:  94 PR Interval:  141 QRS Duration: 82 QT Interval:  334 QTC Calculation: 418 R Axis:   13 Text Interpretation: Sinus rhythm No significant change since last tracing Confirmed by Gwyneth Sprout ([id number]) on 03/15/2021 1:01:47 PM  Radiology DG Chest Port 1 View  Result Date: 03/15/2021 CLINICAL DATA:  Chest pain EXAM: PORTABLE CHEST 1 VIEW COMPARISON:  11/06/2019 FINDINGS: Heart and mediastinal contours are within normal limits. No focal opacities or effusions. No acute bony abnormality. IMPRESSION: No active disease. Electronically Signed   By: Charlett Nose M.D.   On: 03/15/2021 13:13    Procedures Procedures   Medications Ordered in ED Medications - No data to display  ED Course  I have reviewed the triage vital signs and the nursing notes.  Pertinent labs & imaging results that were available during my care of the patient were reviewed by me and considered in my medical decision making (see chart for details).    MDM Rules/Calculators/A&P                           Patient is a 40 year old male with known nonischemic cardiomyopathy and diabetes who had a normal catheterization 7 years ago with clean coronary arteries presenting today with atypical chest pain that is worse with movement and palpation.  No associated symptoms except for a numbing sensation in his face which is now almost completely resolved.  No syncope or shortness of breath.  He has had recent URI symptoms but over the last week has been feeling better.  He did run out of a few of his diabetic medications but has been taking everything else.  He is well-appearing on exam, EKG without acute findings.  Labs are pending. Patient symptoms are not classic for ACS, dissection or PE.  He has no evidence of vesicular lesions are signs of zoster at this time.  He has no abdominal pain and symptoms did not start with eating.  No evidence of stroke at this time.  3:20 PM Labs, imaging and EKG all without acute findings.  Delta troponin is negative.  On reevaluation patient reports his symptoms are gone but can be reproduced with moving his arm.  Low suspicion for ACS or dissection at this time.  Patient to use Tylenol  as needed and follow-up if symptoms continue.  Patient was given return precautions.  MDM   Amount and/or Complexity of Data Reviewed Clinical lab tests: ordered and reviewed Tests in the radiology section of CPT: ordered and reviewed Tests in the medicine section of CPT: ordered and reviewed Independent visualization of images, tracings, or specimens:  yes    Final Clinical Impression(s) / ED Diagnoses Final diagnoses:  Nonspecific chest pain    Rx / DC Orders ED Discharge Orders     None        Gwyneth Sprout, MD 03/15/21 1520

## 2022-07-25 IMAGING — DX DG CHEST 1V PORT
1 series · 1 of 1 positions shown · non-contrast
Comparison: 11/06/2019

CLINICAL DATA: Chest pain

EXAM:
PORTABLE CHEST 1 VIEW

[chest]
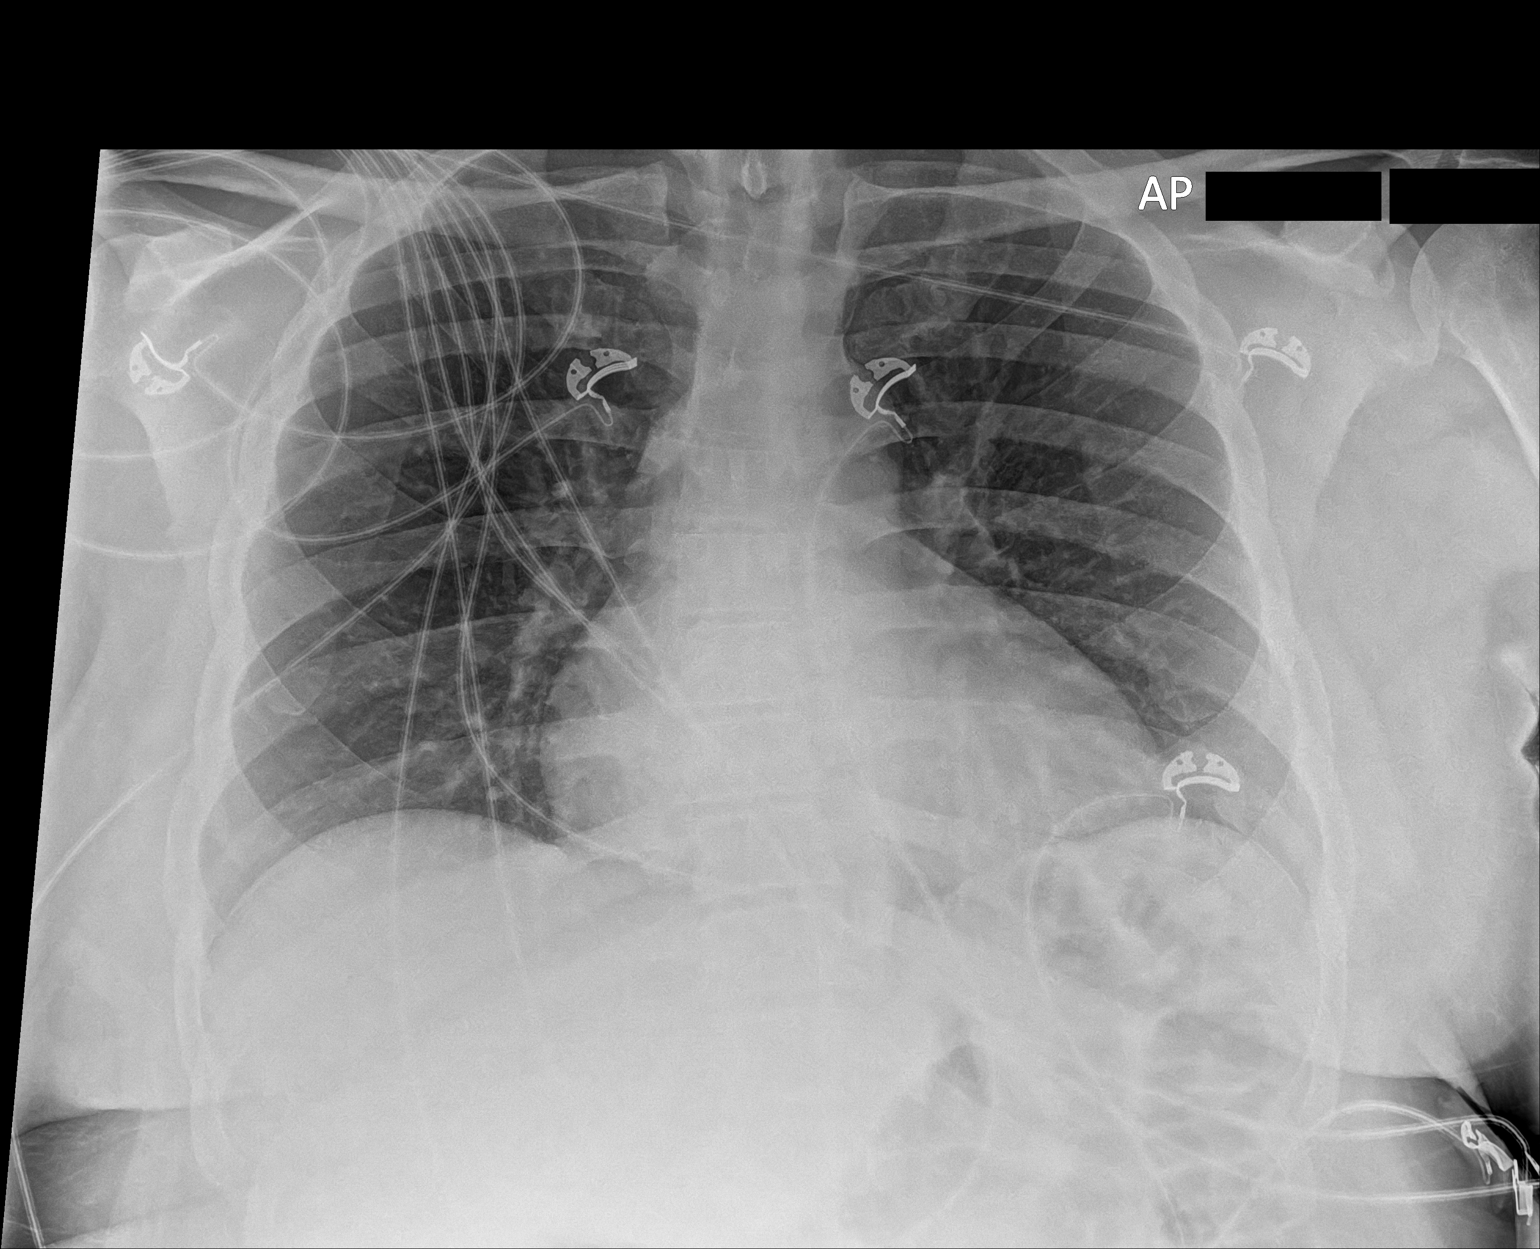

[1 of 1 positions shown; findings below may reference images not displayed]

FINDINGS: Heart and mediastinal contours are within normal limits. No focal
opacities or effusions. No acute bony abnormality.
IMPRESSION: No active disease.

## 2024-02-11 ENCOUNTER — Encounter: Payer: Self-pay | Admitting: Advanced Practice Midwife
